# Patient Record
Sex: Female | Born: 1980 | Race: White | Hispanic: No | Marital: Married | Smoking: Never smoker
Health system: Southern US, Community
[De-identification: ages and names within clinical notes are randomized; demographics above are authoritative.]

## PROBLEM LIST (undated history)

## (undated) DIAGNOSIS — Z789 Other specified health status: Secondary | ICD-10-CM

## (undated) DIAGNOSIS — M419 Scoliosis, unspecified: Secondary | ICD-10-CM

## (undated) HISTORY — PX: NO PAST SURGERIES: SHX2092

---

## 2014-08-08 LAB — OB RESULTS CONSOLE GC/CHLAMYDIA
Chlamydia: NEGATIVE
GC PROBE AMP, GENITAL: NEGATIVE

## 2014-09-06 NOTE — L&D Delivery Note (Signed)
Delivery Note Patient pushed well for two hours.  At 9:51 PM a viable female was delivered via Vaginal, Spontaneous Delivery (Presentation: Middle Occiput Posterior).  APGAR: 8, 9; weight pending .   Placenta status: Intact, Spontaneous.  Cord: 3 vessels with the following complications: None.  Cord pH: n/a  Anesthesia: Epidural  Episiotomy: None Lacerations: Deep left sulcal; left labial Suture Repair: 2.0 3.0 vicryl rapide Est. Blood Loss (mL):  438 mL  Mom to postpartum.  Baby to Couplet care / Skin to Skin.  Nicolet Griffy GEFFEL Ivannah Zody 04/02/2015, 10:45 PM

## 2014-09-11 LAB — OB RESULTS CONSOLE HIV ANTIBODY (ROUTINE TESTING): HIV: NONREACTIVE

## 2014-09-11 LAB — OB RESULTS CONSOLE RUBELLA ANTIBODY, IGM: Rubella: IMMUNE

## 2014-09-11 LAB — OB RESULTS CONSOLE HEPATITIS B SURFACE ANTIGEN: Hepatitis B Surface Ag: NEGATIVE

## 2014-09-11 LAB — OB RESULTS CONSOLE RPR: RPR: NONREACTIVE

## 2014-09-11 LAB — OB RESULTS CONSOLE ANTIBODY SCREEN: Antibody Screen: NEGATIVE

## 2014-09-11 LAB — OB RESULTS CONSOLE ABO/RH: RH TYPE: POSITIVE

## 2015-02-24 LAB — OB RESULTS CONSOLE GBS: GBS: NEGATIVE

## 2015-04-01 ENCOUNTER — Encounter (HOSPITAL_COMMUNITY): Payer: Self-pay

## 2015-04-01 ENCOUNTER — Inpatient Hospital Stay (HOSPITAL_COMMUNITY)
Admission: AD | Admit: 2015-04-01 | Discharge: 2015-04-01 | Disposition: A | Discharge status: Home / Self Care | Payer: BLUE CROSS/BLUE SHIELD | Source: Ambulatory Visit | Attending: Obstetrics and Gynecology | Admitting: Obstetrics and Gynecology

## 2015-04-01 DIAGNOSIS — Z3493 Encounter for supervision of normal pregnancy, unspecified, third trimester: Secondary | ICD-10-CM

## 2015-04-01 DIAGNOSIS — Z3A40 40 weeks gestation of pregnancy: Secondary | ICD-10-CM | POA: Diagnosis present

## 2015-04-01 NOTE — MAU Note (Signed)
Contractions every 5-7 mins since 0400. Denies LOF or vag bleeding. +FM

## 2015-04-01 NOTE — Discharge Instructions (Signed)
Labor Induction  Labor induction is when steps are taken to cause a pregnant woman to begin the labor process. Most women go into labor on their own between 37 weeks and 42 weeks of the pregnancy. When this does not happen or when there is a medical need, methods may be used to induce labor. Labor induction causes a pregnant woman's uterus to contract. It also causes the cervix to soften (ripen), open (dilate), and thin out (efface). Usually, labor is not induced before 39 weeks of the pregnancy unless there is a problem with the baby or mother.  Before inducing labor, your health care provider will consider a number of factors, including the following:  The medical condition of you and the baby.   How many weeks along you are.   The status of the baby's lung maturity.   The condition of the cervix.   The position of the baby.  WHAT ARE THE REASONS FOR LABOR INDUCTION? Labor may be induced for the following reasons:  The health of the baby or mother is at risk.   The pregnancy is overdue by 1 week or more.   The water breaks but labor does not start on its own.   The mother has a health condition or serious illness, such as high blood pressure, infection, placental abruption, or diabetes.  The amniotic fluid amounts are low around the baby.   The baby is distressed.  Convenience or wanting the baby to be born on a certain date is not a reason for inducing labor. WHAT METHODS ARE USED FOR LABOR INDUCTION? Several methods of labor induction may be used, such as:   Prostaglandin medicine. This medicine causes the cervix to dilate and ripen. The medicine will also start contractions. It can be taken by mouth or by inserting a suppository into the vagina.   Inserting a thin tube (catheter) with a balloon on the end into the vagina to dilate the cervix. Once inserted, the balloon is expanded with water, which causes the cervix to open.   Stripping the membranes. Your health  care provider separates amniotic sac tissue from the cervix, causing the cervix to be stretched and causing the release of a hormone called progesterone. This may cause the uterus to contract. It is often done during an office visit. You will be sent home to wait for the contractions to begin. You will then come in for an induction.   Breaking the water. Your health care provider makes a hole in the amniotic sac using a small instrument. Once the amniotic sac breaks, contractions should begin. This may still take hours to see an effect.   Medicine to trigger or strengthen contractions. This medicine is given through an IV access tube inserted into a vein in your arm.  All of the methods of induction, besides stripping the membranes, will be done in the hospital. Induction is done in the hospital so that you and the baby can be carefully monitored.  HOW LONG DOES IT TAKE FOR LABOR TO BE INDUCED? Some inductions can take up to 2-3 days. Depending on the cervix, it usually takes less time. It takes longer when you are induced early in the pregnancy or if this is your first pregnancy. If a mother is still pregnant and the induction has been going on for 2-3 days, either the mother will be sent home or a cesarean delivery will be needed. WHAT ARE THE RISKS ASSOCIATED WITH LABOR INDUCTION? Some of the risks of induction   include:   Changes in fetal heart rate, such as too high, too low, or erratic.   Fetal distress.   Chance of infection for the mother and baby.   Increased chance of having a cesarean delivery.   Breaking off (abruption) of the placenta from the uterus (rare).   Uterine rupture (very rare).  When induction is needed for medical reasons, the benefits of induction may outweigh the risks. WHAT ARE SOME REASONS FOR NOT INDUCING LABOR? Labor induction should not be done if:   It is shown that your baby does not tolerate labor.   You have had previous surgeries on your  uterus, such as a myomectomy or the removal of fibroids.   Your placenta lies very low in the uterus and blocks the opening of the cervix (placenta previa).   Your baby is not in a head-down position.   The umbilical cord drops down into the birth canal in front of the baby. This could cut off the baby's blood and oxygen supply.   You have had a previous cesarean delivery.   There are unusual circumstances, such as the baby being extremely premature.  Document Released: 01/12/2007 Document Revised: 04/25/2013 Document Reviewed: 03/22/2013 Exit[employer] Patient Information 2015 [employer]. This information is not intended to replace advice given to you by your health care provider. Make sure you discuss any questions you have with your health care provider.  

## 2015-04-02 ENCOUNTER — Encounter (HOSPITAL_COMMUNITY): Payer: Self-pay | Admitting: *Deleted

## 2015-04-02 ENCOUNTER — Inpatient Hospital Stay (HOSPITAL_COMMUNITY): Payer: BLUE CROSS/BLUE SHIELD | Admitting: Anesthesiology

## 2015-04-02 ENCOUNTER — Inpatient Hospital Stay (HOSPITAL_COMMUNITY)
Admission: AD | Admit: 2015-04-02 | Discharge: 2015-04-04 | DRG: 775 | Disposition: A | Discharge status: Home / Self Care | Payer: BLUE CROSS/BLUE SHIELD | Source: Ambulatory Visit | Attending: Obstetrics | Admitting: Obstetrics

## 2015-04-02 DIAGNOSIS — Z3A40 40 weeks gestation of pregnancy: Secondary | ICD-10-CM | POA: Diagnosis present

## 2015-04-02 HISTORY — DX: Other specified health status: Z78.9

## 2015-04-02 HISTORY — DX: Scoliosis, unspecified: M41.9

## 2015-04-02 LAB — CBC
HCT: 36.8 % (ref 36.0–46.0)
HEMOGLOBIN: 12.7 g/dL (ref 12.0–15.0)
MCH: 31.9 pg (ref 26.0–34.0)
MCHC: 34.5 g/dL (ref 30.0–36.0)
MCV: 92.5 fL (ref 78.0–100.0)
Platelets: 157 10*3/uL (ref 150–400)
RBC: 3.98 MIL/uL (ref 3.87–5.11)
RDW: 13.2 % (ref 11.5–15.5)
WBC: 14.8 10*3/uL — AB (ref 4.0–10.5)

## 2015-04-02 LAB — TYPE AND SCREEN
ABO/RH(D): O POS
Antibody Screen: NEGATIVE

## 2015-04-02 LAB — ABO/RH: ABO/RH(D): O POS

## 2015-04-02 MED ORDER — PHENYLEPHRINE 40 MCG/ML (10ML) SYRINGE FOR IV PUSH (FOR BLOOD PRESSURE SUPPORT)
80.0000 ug | PREFILLED_SYRINGE | INTRAVENOUS | Status: DC | PRN
  Filled 2015-04-02: qty 20
  Filled 2015-04-02: qty 2

## 2015-04-02 MED ORDER — OXYTOCIN 40 UNITS IN LACTATED RINGERS INFUSION - SIMPLE MED
1.0000 m[IU]/min | INTRAVENOUS | Status: DC
  Administered 2015-04-02: 4 m[IU]/min via INTRAVENOUS
  Administered 2015-04-02: 2 m[IU]/min via INTRAVENOUS
  Filled 2015-04-02: qty 1000

## 2015-04-02 MED ORDER — LIDOCAINE HCL (PF) 1 % IJ SOLN
30.0000 mL | INTRAMUSCULAR | Status: DC | PRN
  Administered 2015-04-02: 30 mL via SUBCUTANEOUS
  Filled 2015-04-02: qty 30

## 2015-04-02 MED ORDER — BUTORPHANOL TARTRATE 1 MG/ML IJ SOLN
1.0000 mg | INTRAMUSCULAR | Status: DC | PRN
  Administered 2015-04-02: 1 mg via INTRAVENOUS
  Filled 2015-04-02: qty 1

## 2015-04-02 MED ORDER — PHENYLEPHRINE 40 MCG/ML (10ML) SYRINGE FOR IV PUSH (FOR BLOOD PRESSURE SUPPORT): 80.0000 ug | PREFILLED_SYRINGE | INTRAVENOUS | Status: DC | PRN

## 2015-04-02 MED ORDER — BUTORPHANOL TARTRATE 1 MG/ML IJ SOLN: 1.0000 mg | INTRAMUSCULAR | Status: DC | PRN

## 2015-04-02 MED ORDER — OXYCODONE-ACETAMINOPHEN 5-325 MG PO TABS: 2.0000 | ORAL_TABLET | ORAL | Status: DC | PRN

## 2015-04-02 MED ORDER — CITRIC ACID-SODIUM CITRATE 334-500 MG/5ML PO SOLN: 30.0000 mL | ORAL | Status: DC | PRN

## 2015-04-02 MED ORDER — OXYTOCIN 40 UNITS IN LACTATED RINGERS INFUSION - SIMPLE MED: 62.5000 mL/h | INTRAVENOUS | Status: DC

## 2015-04-02 MED ORDER — DIPHENHYDRAMINE HCL 50 MG/ML IJ SOLN: 12.5000 mg | INTRAMUSCULAR | Status: DC | PRN

## 2015-04-02 MED ORDER — FENTANYL 2.5 MCG/ML BUPIVACAINE 1/10 % EPIDURAL INFUSION (WH - ANES): 14.0000 mL/h | INTRAMUSCULAR | Status: DC | PRN

## 2015-04-02 MED ORDER — OXYCODONE-ACETAMINOPHEN 5-325 MG PO TABS
1.0000 | ORAL_TABLET | ORAL | Status: DC | PRN
  Administered 2015-04-02: 1 via ORAL
  Filled 2015-04-02: qty 1

## 2015-04-02 MED ORDER — OXYTOCIN BOLUS FROM INFUSION
500.0000 mL | INTRAVENOUS | Status: DC
  Administered 2015-04-02: 500 mL via INTRAVENOUS

## 2015-04-02 MED ORDER — ACETAMINOPHEN 325 MG PO TABS: 650.0000 mg | ORAL_TABLET | ORAL | Status: DC | PRN

## 2015-04-02 MED ORDER — EPHEDRINE 5 MG/ML INJ
10.0000 mg | INTRAVENOUS | Status: DC | PRN
  Filled 2015-04-02: qty 2

## 2015-04-02 MED ORDER — TERBUTALINE SULFATE 1 MG/ML IJ SOLN
0.2500 mg | Freq: Once | INTRAMUSCULAR | Status: AC | PRN
  Filled 2015-04-02: qty 1

## 2015-04-02 MED ORDER — LACTATED RINGERS IV SOLN: 500.0000 mL | INTRAVENOUS | Status: DC | PRN

## 2015-04-02 MED ORDER — LACTATED RINGERS IV SOLN
INTRAVENOUS | Status: DC
  Administered 2015-04-02 (×2): via INTRAVENOUS

## 2015-04-02 MED ORDER — ONDANSETRON HCL 4 MG/2ML IJ SOLN: 4.0000 mg | Freq: Four times a day (QID) | INTRAMUSCULAR | Status: DC | PRN

## 2015-04-02 MED ORDER — FENTANYL 2.5 MCG/ML BUPIVACAINE 1/10 % EPIDURAL INFUSION (WH - ANES)
14.0000 mL/h | INTRAMUSCULAR | Status: DC | PRN
  Administered 2015-04-02: 14 mL/h via EPIDURAL
  Filled 2015-04-02: qty 125

## 2015-04-02 MED ORDER — LIDOCAINE HCL (PF) 1 % IJ SOLN
INTRAMUSCULAR | Status: DC | PRN
  Administered 2015-04-02 (×2): 4 mL via EPIDURAL

## 2015-04-02 NOTE — Anesthesia Procedure Notes (Signed)
Epidural Patient location during procedure: OB Start time: 04/02/2015 3:05 PM  Staffing Anesthesiologist: Mal Amabile Performed by: anesthesiologist   Preanesthetic Checklist Completed: patient identified, site marked, surgical consent, pre-op evaluation, timeout performed, IV checked, risks and benefits discussed and monitors and equipment checked  Epidural Patient position: sitting Prep: site prepped and draped and DuraPrep Patient monitoring: continuous pulse ox and blood pressure Approach: midline Location: L3-L4 Injection technique: LOR air  Needle:  Needle type: Tuohy  Needle gauge: 17 G Needle length: 9 cm and 9 Needle insertion depth: 4 cm Catheter type: closed end flexible Catheter size: 19 Gauge Catheter at skin depth: 9 cm Test dose: negative and Other  Assessment Events: blood not aspirated, injection not painful, no injection resistance, negative IV test and no paresthesia  Additional Notes Patient identified. Risks and benefits discussed including failed block, incomplete  Pain control, post dural puncture headache, nerve damage, paralysis, blood pressure Changes, nausea, vomiting, reactions to medications-both toxic and allergic and post Partum back pain. All questions were answered. Patient expressed understanding and wished to proceed. Sterile technique was used throughout procedure. Epidural site was Dressed with sterile barrier dressing. No paresthesias, signs of intravascular injection Or signs of intrathecal spread were encountered.  Patient was more comfortable after the epidural was dosed. Please see RN's note for documentation of vital signs and FHR which are stable.

## 2015-04-02 NOTE — MAU Note (Signed)
Contractions every 5-6 minutes. Denies bright red vaginal bleeding.  States Positive bloody show.  Positive fetal movement Denies SROM/LOF  Denies any infections/complications of pregnancy  GBS negative per patient

## 2015-04-02 NOTE — Anesthesia Preprocedure Evaluation (Signed)
Anesthesia Evaluation  Patient identified by MRN, date of birth, ID band Patient awake    Reviewed: Allergy & Precautions, Patient's Chart, lab work & pertinent test results  Airway Mallampati: II  TM Distance: >3 FB Neck ROM: Full    Dental no notable dental hx. (+) Teeth Intact   Pulmonary neg pulmonary ROS,  breath sounds clear to auscultation  Pulmonary exam normal       Cardiovascular negative cardio ROS Normal cardiovascular examRhythm:Regular Rate:Normal     Neuro/Psych negative neurological ROS  negative psych ROS   GI/Hepatic negative GI ROS, Neg liver ROS,   Endo/Other  negative endocrine ROS  Renal/GU negative Renal ROS  negative genitourinary   Musculoskeletal negative musculoskeletal ROS (+)   Abdominal   Peds  Hematology negative hematology ROS (+)   Anesthesia Other Findings   Reproductive/Obstetrics (+) Pregnancy                             Anesthesia Physical Anesthesia Plan  ASA: II  Anesthesia Plan: Epidural   Post-op Pain Management:    Induction:   Airway Management Planned: Natural Airway  Additional Equipment:   Intra-op Plan:   Post-operative Plan:   Informed Consent: I have reviewed the patients History and Physical, chart, labs and discussed the procedure including the risks, benefits and alternatives for the proposed anesthesia with the patient or authorized representative who has indicated his/her understanding and acceptance.     Plan Discussed with: Anesthesiologist  Anesthesia Plan Comments:         Anesthesia Quick Evaluation

## 2015-04-02 NOTE — H&P (Signed)
34 y.o. G1P0 @ [redacted]w[redacted]d presents with painful ctx since yesterday evening.  Was seen in MAU o/n and was 3 cm.  Otherwise has good fetal movement and no bleeding.  No past medical history on file. No past surgical history on file.  OB History  Gravida Para Term Preterm AB SAB TAB Ectopic Multiple Living  1             # Outcome Date GA Lbr Len/2nd Weight Sex Delivery Anes PTL Lv  1 Current               History   Social History  . Marital Status: Married    Spouse Name: N/A  . Number of Children: N/A  . Years of Education: N/A   Occupational History  . Not on file.   Social History Main Topics  . Smoking status: Never Smoker   . Smokeless tobacco: Not on file  . Alcohol Use: No  . Drug Use: No  . Sexual Activity: Yes   Other Topics Concern  . Not on file   Social History Narrative  . No narrative on file   Review of patient's allergies indicates no known allergies.    Prenatal Transfer Tool  Maternal Diabetes: No Genetic Screening: Normal Maternal Ultrasounds/Referrals: Normal Fetal Ultrasounds or other Referrals:  None Maternal Substance Abuse:  No Significant Maternal Medications:  None Significant Maternal Lab Results: None  ABO, Rh: O/Positive/-- (01/06 0000) Antibody: Negative (01/06 0000) Rubella:   RPR: Nonreactive (01/06 0000)  HBsAg: Negative (01/06 0000)  HIV: Non-reactive (01/06 0000)  GBS: Negative (06/20 0000)    Other PNC: uncomplicated.  122/75 P 96 RR 18   General:  NAD Abdomen:  soft, gravid, EFW 6.5# Ex:  no edema SVE:  6-7/100/0 FHTs:  130s, mod var, cat 1 Toco:  q5 min   A/P   47 y.o. G1P0 [redacted]w[redacted]d presents with labor FSR/ vtx/ GBS neg Epidural upon maternal request  Monica Huerta [employer]

## 2015-04-02 NOTE — Progress Notes (Signed)
Comfortable w epidural  EFM: 130s, mod var, occ early decel w ctx Toco: q4-5 min SVE: 9/100/0, + scalp stim AROM--scant clear fluid  If not complete in 1 hour, will start pitocin.  Anticipate SVD

## 2015-04-03 ENCOUNTER — Inpatient Hospital Stay (HOSPITAL_COMMUNITY): Admit: 2015-04-03 | Payer: BLUE CROSS/BLUE SHIELD

## 2015-04-03 LAB — CBC
HCT: 32.5 % — ABNORMAL LOW (ref 36.0–46.0)
Hemoglobin: 10.9 g/dL — ABNORMAL LOW (ref 12.0–15.0)
MCH: 31.2 pg (ref 26.0–34.0)
MCHC: 33.5 g/dL (ref 30.0–36.0)
MCV: 93.1 fL (ref 78.0–100.0)
Platelets: 148 10*3/uL — ABNORMAL LOW (ref 150–400)
RBC: 3.49 MIL/uL — AB (ref 3.87–5.11)
RDW: 13.6 % (ref 11.5–15.5)
WBC: 16 10*3/uL — AB (ref 4.0–10.5)

## 2015-04-03 LAB — RPR: RPR: NONREACTIVE

## 2015-04-03 MED ORDER — ONDANSETRON HCL 4 MG/2ML IJ SOLN: 4.0000 mg | INTRAMUSCULAR | Status: DC | PRN

## 2015-04-03 MED ORDER — DIPHENHYDRAMINE HCL 25 MG PO CAPS: 25.0000 mg | ORAL_CAPSULE | Freq: Four times a day (QID) | ORAL | Status: DC | PRN

## 2015-04-03 MED ORDER — OXYCODONE-ACETAMINOPHEN 5-325 MG PO TABS: 2.0000 | ORAL_TABLET | ORAL | Status: DC | PRN

## 2015-04-03 MED ORDER — SENNOSIDES-DOCUSATE SODIUM 8.6-50 MG PO TABS
2.0000 | ORAL_TABLET | ORAL | Status: DC
  Administered 2015-04-03 – 2015-04-04 (×2): 2 via ORAL
  Filled 2015-04-03 (×2): qty 2

## 2015-04-03 MED ORDER — IBUPROFEN 600 MG PO TABS
600.0000 mg | ORAL_TABLET | Freq: Four times a day (QID) | ORAL | Status: DC
  Administered 2015-04-03 – 2015-04-04 (×7): 600 mg via ORAL
  Filled 2015-04-03 (×7): qty 1

## 2015-04-03 MED ORDER — SIMETHICONE 80 MG PO CHEW: 80.0000 mg | CHEWABLE_TABLET | ORAL | Status: DC | PRN

## 2015-04-03 MED ORDER — DIBUCAINE 1 % RE OINT: 1.0000 "application " | TOPICAL_OINTMENT | RECTAL | Status: DC | PRN

## 2015-04-03 MED ORDER — WITCH HAZEL-GLYCERIN EX PADS: 1.0000 "application " | MEDICATED_PAD | CUTANEOUS | Status: DC | PRN

## 2015-04-03 MED ORDER — BENZOCAINE-MENTHOL 20-0.5 % EX AERO
1.0000 | INHALATION_SPRAY | CUTANEOUS | Status: DC | PRN
Start: 2015-04-03 — End: 2015-04-04

## 2015-04-03 MED ORDER — ONDANSETRON HCL 4 MG PO TABS: 4.0000 mg | ORAL_TABLET | ORAL | Status: DC | PRN

## 2015-04-03 MED ORDER — PRENATAL MULTIVITAMIN CH
1.0000 | ORAL_TABLET | Freq: Every day | ORAL | Status: DC
  Administered 2015-04-03 – 2015-04-04 (×2): 1 via ORAL
  Filled 2015-04-03 (×2): qty 1

## 2015-04-03 MED ORDER — TETANUS-DIPHTH-ACELL PERTUSSIS 5-2.5-18.5 LF-MCG/0.5 IM SUSP: 0.5000 mL | Freq: Once | INTRAMUSCULAR | Status: DC

## 2015-04-03 MED ORDER — ACETAMINOPHEN 325 MG PO TABS
650.0000 mg | ORAL_TABLET | ORAL | Status: DC | PRN
  Administered 2015-04-03: 650 mg via ORAL
  Filled 2015-04-03: qty 2

## 2015-04-03 MED ORDER — OXYCODONE-ACETAMINOPHEN 5-325 MG PO TABS: 1.0000 | ORAL_TABLET | ORAL | Status: DC | PRN

## 2015-04-03 MED ORDER — LANOLIN HYDROUS EX OINT: TOPICAL_OINTMENT | CUTANEOUS | Status: DC | PRN

## 2015-04-03 NOTE — Lactation Note (Signed)
This note was copied from the chart of Monica Evaluna Utke. Lactation Consultation Note  Patient Name: Monica Huerta [id number] Date: 04/03/2015 Reason for consult: Follow-up assessment  With this mom and now 20 hours old baby, who mom was needing help latching. I was asked by her nurse to go and help, but baby was in dad's arms aat this time. i told mom to call when baby cues.    Maternal Data    Feeding Feeding Type: Breast Fed Length of feed: 10 min  LATCH Score/Interventions Latch: Repeated attempts needed to sustain latch, nipple held in mouth throughout feeding, stimulation needed to elicit sucking reflex. Intervention(s): Skin to skin Intervention(s): Assist with latch  Audible Swallowing: A few with stimulation Intervention(s): Skin to skin  Type of Nipple: Flat Intervention(s): Hand pump  Comfort (Breast/Nipple): Soft / non-tender     Hold (Positioning): Assistance needed to correctly position infant at breast and maintain latch.  LATCH Score: 6  Lactation Tools Discussed/Used     Consult Status Consult Status: Follow-up Date: 04/04/15 Follow-up type: In-patient    Alfred Levins 04/03/2015, 6:59 PM

## 2015-04-03 NOTE — Progress Notes (Signed)
Post Partum Day 1 Subjective: no complaints, up ad lib, voiding, tolerating PO and + flatus  Objective: Blood pressure 106/58, pulse 74, temperature 98.8 F (37.1 C), temperature source Oral, resp. rate 18, height  (1.626 m), weight 73.029 kg (161 lb), SpO2 99 %, unknown if currently breastfeeding.  Physical Exam:  General: alert, cooperative and appears stated age Lochia: appropriate Uterine Fundus: firm   Recent Labs  04/02/15 1235 04/03/15 0530  HGB 12.7 10.9*  HCT 36.8 32.5*    Assessment/Plan: Plan for discharge tomorrow and Breastfeeding   LOS: 1 day   Davon Folta H. 04/03/2015, 8:56 AM

## 2015-04-03 NOTE — Lactation Note (Signed)
This note was copied from the chart of Monica Huerta. Lactation Consultation Note  Patient Name: Monica Huerta [id number] Date: 04/03/2015 Reason for consult: Follow-up assessment  With this mom of a now 43 hours old term baby, mom having trouble keeping baby latched, and baby not satsfifed after breastfeeding.. I assisted mom with latching baby in football hold, but he would not maintain latch, hyper active rooting noted. On exam of baby's mouth, he has an anterior thin, stringy short tongue  frenulum, and an upper lip frenulum that extends to the gum line. I showed these to parents, and explained that this is probably why he has not been able to maintain latch. Mom is being set up with DEP By next lactation consultant, and I fitted mom with a 20 nipple shield. The baby latched with the shileld, was better able to maintain latch, but was still head bobbing and acting very hungry. With hand expression, I was not able to express more than a drop of colostrum, I told the parents it may be time to supplement him with Alimentum formula, and to protect mom's milk with DEP, and preferrably supplement the baby with mom's EBM. Mom does not make eye contact, and is very quiet. I advised hth mom to speak to the pediatrician about my findings in the morning.    Maternal Data    Feeding Feeding Type: Breast Fed Length of feed: 10 min  LATCH Score/Interventions Latch: Repeated attempts needed to sustain latch, nipple held in mouth throughout feeding, stimulation needed to elicit sucking reflex. (baby wit upper lip and tongue tie - on and off breast addec 20 NS with imporved latch, but baby very hungry) Intervention(s): Waking techniques Intervention(s): Assist with latch  Audible Swallowing: None Intervention(s): Hand expression  Type of Nipple: Everted at rest and after stimulation Intervention(s): Double electric pump  Comfort (Breast/Nipple): Soft / non-tender     Hold (Positioning): Assistance  needed to correctly position infant at breast and maintain latch.  LATCH Score: 6  Lactation Tools Discussed/Used Tools: Nipple Dorris Carnes;Pump Nipple shield size: 20 Breast pump type: Double-Electric Breast Pump Initiated by:: Dario Ave, RN IBCLC - opump placed in room by Chauncey Cruel to set up for mom  Date initiated:: 04/03/15   Consult Status Consult Status: Follow-up Date: 04/04/15 Follow-up type: In-patient    Alfred Levins 04/03/2015, 7:45 PM

## 2015-04-03 NOTE — Anesthesia Postprocedure Evaluation (Signed)
Anesthesia Post Note  Patient: Monica Huerta  Procedure(s) Performed: * No procedures listed *  Anesthesia type: Epidural  Patient location: Mother/Baby  Post pain: Pain level controlled  Post assessment: Post-op Vital signs reviewed  Last Vitals:  Filed Vitals:   04/03/15 0450  BP: 106/58  Pulse: 74  Temp: 37.1 C  Resp: 18    Post vital signs: Reviewed  Level of consciousness: awake  Complications: No apparent anesthesia complications

## 2015-04-03 NOTE — Plan of Care (Signed)
Problem: Consults Goal: Skin Care Protocol Initiated - if Braden Score 18 or less If consults are not indicated, leave blank or document N/A  Outcome: Not Applicable Date Met:  94/76/54 Braden scale 21.

## 2015-04-03 NOTE — Lactation Note (Signed)
This note was copied from the chart of Monica Maudy Yonan. Lactation Consultation Note: Mom reports baby has latched well with no pain- has been nursing for about 10 min. Last fed about 1 hour and is asleep in dad's arms. Encouraged to watch for feeding cues and feed whenever she sees them. No questions at present. BF brochure given with resources for support after DC. To call for assist prn  Patient Name: Monica Huerta [id number] Date: 04/03/2015 Reason for consult: Initial assessment   Maternal Data Formula Feeding for Exclusion: No Does the patient have breastfeeding experience prior to this delivery?: No  Feeding   LATCH Score/Interventions                      Lactation Tools Discussed/Used     Consult Status Consult Status: Follow-up Date: 04/04/15 Follow-up type: In-patient    Pamelia Hoit 04/03/2015, 2:22 PM

## 2015-04-04 ENCOUNTER — Encounter (HOSPITAL_COMMUNITY): Payer: Self-pay | Admitting: *Deleted

## 2015-04-04 NOTE — Discharge Summary (Signed)
Obstetric Discharge Summary Reason for Admission: onset of labor Prenatal Procedures: NST and ultrasound Intrapartum Procedures: spontaneous vaginal delivery Postpartum Procedures: none Complications-Operative and Postpartum: Sulcal and labial laceration HEMOGLOBIN  Date Value Ref Range Status  04/03/2015 10.9* 12.0 - 15.0 g/dL Final   HCT  Date Value Ref Range Status  04/03/2015 32.5* 36.0 - 46.0 % Final    Physical Exam:  General: alert, cooperative and appears stated age 62: appropriate Uterine Fundus: firm Incision: healing well DVT Evaluation: No evidence of DVT seen on physical exam.  Discharge Diagnoses: Term Pregnancy-delivered  Discharge Information: Date: 04/04/2015 Activity: pelvic rest Diet: routine Medications: Ibuprofen, Colace and Percocet Condition: improved Instructions: refer to practice specific booklet Discharge to: home Follow-up Information    Follow up with [facility] GEFFEL Chestine Spore, MD In 4 weeks.   Specialty:  Obstetrics   Why:  For a postpartum evaluation   Contact information:   [address] [place] [address] [phone]       Newborn Data: Live born female  Birth Weight: 7 lb 4.8 oz (3310 g) APGAR: 8, 9  Home with mother.  Monica Tift H. 04/04/2015, 12:33 AM

## 2015-04-04 NOTE — Lactation Note (Signed)
This note was copied from the chart of Monica Cailin Gebel. Lactation Consultation Note  Follow up visit made to observe feeding.  Baby starting to wake and show feeding cues.  Positioned baby in football hold on left side.  Mom correctly applied 20 mm nipple shield.  Baby latched easily and deep. He nursed actively with many audible swallows.  Baby came off breast content and shield full of milk.  Report of feeding assessment given to pediatrician.  Patient Name: Monica Huerta [id number] Date: 04/04/2015 Reason for consult: Follow-up assessment   Maternal Data    Feeding Feeding Type: Breast Fed Length of feed: 10 min  LATCH Score/Interventions Latch: Grasps breast easily, tongue down, lips flanged, rhythmical sucking. Intervention(s): Waking techniques Intervention(s): Breast massage;Breast compression  Audible Swallowing: Spontaneous and intermittent  Type of Nipple: Everted at rest and after stimulation  Comfort (Breast/Nipple): Soft / non-tender     Hold (Positioning): No assistance needed to correctly position infant at breast. Intervention(s): Breastfeeding basics reviewed;Support Pillows;Position options;Skin to skin  LATCH Score: 10  Lactation Tools Discussed/Used Tools: Nipple Shields Nipple shield size: 20   Consult Status Consult Status: Complete    Rhiannon Sassaman S 04/04/2015, 2:29 PM

## 2015-04-04 NOTE — Lactation Note (Signed)
This note was copied from the chart of Monica Huerta. Lactation Consultation Note  Follow up visit made prior to discharge.  Mom states baby has been nursing well with nipple shield and she does see colostrum in the shield when baby comes off.  Discharge instructions given including engorgement treatment.  Encouraged to call for assist if desires prior to discharge.  Patient Name: Monica Huerta [id number] Date: 04/04/2015     Maternal Data    Feeding Feeding Type: Breast Fed Length of feed: 10 min  LATCH Score/Interventions                      Lactation Tools Discussed/Used     Consult Status      [name] 04/04/2015, 10:18 AM

## 2015-04-04 NOTE — Discharge Summary (Signed)
Obstetric Discharge Summary Reason for Admission: onset of labor Prenatal Procedures: ultrasound Intrapartum Procedures: spontaneous vaginal delivery Postpartum Procedures: none Complications-Operative and Postpartum: none HEMOGLOBIN  Date Value Ref Range Status  04/03/2015 10.9* 12.0 - 15.0 g/dL Final   HCT  Date Value Ref Range Status  04/03/2015 32.5* 36.0 - 46.0 % Final    Physical Exam:  General: alert Lochia: appropriate Uterine Fundus: firm   Discharge Diagnoses: Term Pregnancy-delivered  Discharge Information: Date: 04/04/2015 Activity: pelvic rest Diet: routine Medications: PNV and Ibuprofen Condition: stable Instructions: refer to practice specific booklet Discharge to: home Follow-up Information    Follow up with [facility] GEFFEL Chestine Spore, MD In 4 weeks.   Specialty:  Obstetrics   Why:  For a postpartum evaluation   Contact information:   [address] [place] [address] [phone]       Follow up with [facility], MD In 1 month.   Specialty:  Obstetrics   Contact information:   [address] [place] [address] [phone]       Newborn Data: Live born female  Birth Weight: 7 lb 4.8 oz (3310 g) APGAR: 8, 9  Home with mother.  Avalynne Diver E 04/04/2015, 9:37 AM

## 2015-04-04 NOTE — Progress Notes (Signed)
PPD#2 Pt doing well Will discharge.

## 2016-09-06 NOTE — L&D Delivery Note (Signed)
Delivery Note At 2:02 PM a viable and healthy female was delivered via Vaginal, Spontaneous Delivery (Presentation: Vertex occiput; anterior ).  APGAR: 9, 9; weight  pending.   Placenta status: spontaneous, intact.  Cord: 3V with a loose nuchal cord x 1 Anesthesia:  Epidural Episiotomy:  none Lacerations: 2nd degree Suture Repair: 3.0 vicryl Est. Blood Loss (mL):  250 cc  Mom to postpartum.  Baby to Couplet care / Skin to Skin.  Harshil Cavallaro H. 07/01/2017, 2:22 PM

## 2016-12-28 LAB — OB RESULTS CONSOLE ANTIBODY SCREEN: Antibody Screen: NEGATIVE

## 2016-12-28 LAB — OB RESULTS CONSOLE ABO/RH: RH TYPE: POSITIVE

## 2016-12-28 LAB — OB RESULTS CONSOLE RPR: RPR: NONREACTIVE

## 2016-12-28 LAB — OB RESULTS CONSOLE HEPATITIS B SURFACE ANTIGEN: HEP B S AG: NEGATIVE

## 2016-12-28 LAB — OB RESULTS CONSOLE GC/CHLAMYDIA
Chlamydia: NEGATIVE
Gonorrhea: NEGATIVE

## 2016-12-28 LAB — OB RESULTS CONSOLE RUBELLA ANTIBODY, IGM: RUBELLA: IMMUNE

## 2016-12-28 LAB — OB RESULTS CONSOLE HIV ANTIBODY (ROUTINE TESTING): HIV: NONREACTIVE

## 2017-02-23 ENCOUNTER — Other Ambulatory Visit (HOSPITAL_COMMUNITY): Payer: Self-pay | Admitting: Obstetrics and Gynecology

## 2017-02-23 DIAGNOSIS — Z3A23 23 weeks gestation of pregnancy: Secondary | ICD-10-CM

## 2017-02-23 DIAGNOSIS — O283 Abnormal ultrasonic finding on antenatal screening of mother: Secondary | ICD-10-CM

## 2017-02-23 DIAGNOSIS — Z3689 Encounter for other specified antenatal screening: Secondary | ICD-10-CM

## 2017-03-11 ENCOUNTER — Encounter (HOSPITAL_COMMUNITY): Payer: Self-pay | Admitting: *Deleted

## 2017-03-15 ENCOUNTER — Encounter (HOSPITAL_COMMUNITY): Payer: Self-pay

## 2017-03-15 ENCOUNTER — Ambulatory Visit (HOSPITAL_COMMUNITY)
Admission: RE | Admit: 2017-03-15 | Discharge: 2017-03-15 | Disposition: A | Discharge status: Home / Self Care | Payer: 59 | Source: Ambulatory Visit | Attending: Obstetrics and Gynecology | Admitting: Obstetrics and Gynecology

## 2017-03-15 ENCOUNTER — Ambulatory Visit (HOSPITAL_COMMUNITY): Payer: BLUE CROSS/BLUE SHIELD

## 2017-03-15 DIAGNOSIS — Z3A23 23 weeks gestation of pregnancy: Secondary | ICD-10-CM | POA: Insufficient documentation

## 2017-03-15 DIAGNOSIS — O283 Abnormal ultrasonic finding on antenatal screening of mother: Secondary | ICD-10-CM

## 2017-03-15 DIAGNOSIS — Z3689 Encounter for other specified antenatal screening: Secondary | ICD-10-CM

## 2017-03-15 DIAGNOSIS — O359XX0 Maternal care for (suspected) fetal abnormality and damage, unspecified, not applicable or unspecified: Secondary | ICD-10-CM | POA: Insufficient documentation

## 2017-03-16 ENCOUNTER — Other Ambulatory Visit (HOSPITAL_COMMUNITY): Payer: Self-pay | Admitting: *Deleted

## 2017-03-16 DIAGNOSIS — O359XX0 Maternal care for (suspected) fetal abnormality and damage, unspecified, not applicable or unspecified: Secondary | ICD-10-CM

## 2017-03-22 ENCOUNTER — Other Ambulatory Visit: Payer: Self-pay

## 2017-04-26 ENCOUNTER — Other Ambulatory Visit (HOSPITAL_COMMUNITY): Payer: Self-pay | Admitting: *Deleted

## 2017-04-26 ENCOUNTER — Ambulatory Visit (HOSPITAL_COMMUNITY)
Admission: RE | Admit: 2017-04-26 | Discharge: 2017-04-26 | Disposition: A | Discharge status: Home / Self Care | Payer: 59 | Source: Ambulatory Visit | Attending: Obstetrics and Gynecology | Admitting: Obstetrics and Gynecology

## 2017-04-26 ENCOUNTER — Encounter (HOSPITAL_COMMUNITY): Payer: Self-pay

## 2017-04-26 ENCOUNTER — Other Ambulatory Visit (HOSPITAL_COMMUNITY): Payer: Self-pay | Admitting: Obstetrics and Gynecology

## 2017-04-26 DIAGNOSIS — O359XX0 Maternal care for (suspected) fetal abnormality and damage, unspecified, not applicable or unspecified: Secondary | ICD-10-CM

## 2017-04-26 DIAGNOSIS — O288 Other abnormal findings on antenatal screening of mother: Secondary | ICD-10-CM | POA: Diagnosis not present

## 2017-04-26 DIAGNOSIS — Q600 Renal agenesis, unilateral: Secondary | ICD-10-CM | POA: Insufficient documentation

## 2017-04-26 DIAGNOSIS — Z3A29 29 weeks gestation of pregnancy: Secondary | ICD-10-CM

## 2017-04-26 DIAGNOSIS — O403XX0 Polyhydramnios, third trimester, not applicable or unspecified: Secondary | ICD-10-CM | POA: Diagnosis not present

## 2017-05-12 ENCOUNTER — Encounter (HOSPITAL_COMMUNITY): Payer: Self-pay

## 2017-05-12 ENCOUNTER — Ambulatory Visit (HOSPITAL_COMMUNITY): Payer: BLUE CROSS/BLUE SHIELD

## 2017-05-12 ENCOUNTER — Ambulatory Visit (HOSPITAL_COMMUNITY)
Admission: RE | Admit: 2017-05-12 | Discharge: 2017-05-12 | Disposition: A | Discharge status: Home / Self Care | Payer: 59 | Source: Ambulatory Visit | Attending: Obstetrics and Gynecology | Admitting: Obstetrics and Gynecology

## 2017-05-12 DIAGNOSIS — Z3A31 31 weeks gestation of pregnancy: Secondary | ICD-10-CM | POA: Diagnosis not present

## 2017-05-12 DIAGNOSIS — O403XX0 Polyhydramnios, third trimester, not applicable or unspecified: Secondary | ICD-10-CM | POA: Insufficient documentation

## 2017-05-12 DIAGNOSIS — O359XX0 Maternal care for (suspected) fetal abnormality and damage, unspecified, not applicable or unspecified: Secondary | ICD-10-CM | POA: Diagnosis present

## 2017-05-12 DIAGNOSIS — Q600 Renal agenesis, unilateral: Secondary | ICD-10-CM

## 2017-06-07 LAB — OB RESULTS CONSOLE GBS: GBS: NEGATIVE

## 2017-06-21 ENCOUNTER — Other Ambulatory Visit: Payer: Self-pay | Admitting: Obstetrics and Gynecology

## 2017-06-21 ENCOUNTER — Telehealth (HOSPITAL_COMMUNITY): Payer: Self-pay | Admitting: *Deleted

## 2017-06-21 ENCOUNTER — Encounter (HOSPITAL_COMMUNITY): Payer: Self-pay | Admitting: *Deleted

## 2017-06-21 NOTE — Telephone Encounter (Signed)
Preadmission screen  

## 2017-07-01 ENCOUNTER — Inpatient Hospital Stay (HOSPITAL_COMMUNITY)
Admission: RE | Admit: 2017-07-01 | Discharge: 2017-07-03 | DRG: 807 | Disposition: A | Discharge status: Home / Self Care | Payer: 59 | Source: Ambulatory Visit | Attending: Obstetrics and Gynecology | Admitting: Obstetrics and Gynecology

## 2017-07-01 ENCOUNTER — Inpatient Hospital Stay (HOSPITAL_COMMUNITY): Payer: 59 | Admitting: Anesthesiology

## 2017-07-01 ENCOUNTER — Encounter (HOSPITAL_COMMUNITY): Payer: Self-pay

## 2017-07-01 ENCOUNTER — Inpatient Hospital Stay (HOSPITAL_COMMUNITY): Admission: AD | Admit: 2017-07-01 | Payer: 59 | Source: Ambulatory Visit | Admitting: Obstetrics

## 2017-07-01 VITALS — BP 116/80 | HR 64 | Temp 97.9°F | Resp 18 | Ht 65.0 in | Wt 150.0 lb

## 2017-07-01 DIAGNOSIS — O358XX0 Maternal care for other (suspected) fetal abnormality and damage, not applicable or unspecified: Secondary | ICD-10-CM | POA: Diagnosis present

## 2017-07-01 DIAGNOSIS — O403XX0 Polyhydramnios, third trimester, not applicable or unspecified: Secondary | ICD-10-CM | POA: Diagnosis present

## 2017-07-01 DIAGNOSIS — Z3A39 39 weeks gestation of pregnancy: Secondary | ICD-10-CM

## 2017-07-01 DIAGNOSIS — Z3493 Encounter for supervision of normal pregnancy, unspecified, third trimester: Secondary | ICD-10-CM

## 2017-07-01 DIAGNOSIS — Z3490 Encounter for supervision of normal pregnancy, unspecified, unspecified trimester: Secondary | ICD-10-CM

## 2017-07-01 LAB — CBC
HEMATOCRIT: 35.9 % — AB (ref 36.0–46.0)
HEMOGLOBIN: 12.1 g/dL (ref 12.0–15.0)
MCH: 31.2 pg (ref 26.0–34.0)
MCHC: 33.7 g/dL (ref 30.0–36.0)
MCV: 92.5 fL (ref 78.0–100.0)
Platelets: 163 10*3/uL (ref 150–400)
RBC: 3.88 MIL/uL (ref 3.87–5.11)
RDW: 14.6 % (ref 11.5–15.5)
WBC: 9 10*3/uL (ref 4.0–10.5)

## 2017-07-01 LAB — TYPE AND SCREEN
ABO/RH(D): O POS
ANTIBODY SCREEN: NEGATIVE

## 2017-07-01 LAB — RPR: RPR Ser Ql: NONREACTIVE

## 2017-07-01 MED ORDER — LACTATED RINGERS IV SOLN: 500.0000 mL | INTRAVENOUS | Status: DC | PRN

## 2017-07-01 MED ORDER — BUTORPHANOL TARTRATE 1 MG/ML IJ SOLN: 1.0000 mg | INTRAMUSCULAR | Status: DC | PRN

## 2017-07-01 MED ORDER — SOD CITRATE-CITRIC ACID 500-334 MG/5ML PO SOLN: 30.0000 mL | ORAL | Status: DC | PRN

## 2017-07-01 MED ORDER — ACETAMINOPHEN 325 MG PO TABS: 650.0000 mg | ORAL_TABLET | ORAL | Status: DC | PRN

## 2017-07-01 MED ORDER — FENTANYL 2.5 MCG/ML BUPIVACAINE 1/10 % EPIDURAL INFUSION (WH - ANES): 14.0000 mL/h | INTRAMUSCULAR | Status: DC | PRN

## 2017-07-01 MED ORDER — TETANUS-DIPHTH-ACELL PERTUSSIS 5-2.5-18.5 LF-MCG/0.5 IM SUSP: 0.5000 mL | Freq: Once | INTRAMUSCULAR | Status: DC

## 2017-07-01 MED ORDER — LACTATED RINGERS IV SOLN: 500.0000 mL | Freq: Once | INTRAVENOUS | Status: DC

## 2017-07-01 MED ORDER — ONDANSETRON HCL 4 MG/2ML IJ SOLN: 4.0000 mg | Freq: Four times a day (QID) | INTRAMUSCULAR | Status: DC | PRN

## 2017-07-01 MED ORDER — OXYCODONE-ACETAMINOPHEN 5-325 MG PO TABS: 2.0000 | ORAL_TABLET | ORAL | Status: DC | PRN

## 2017-07-01 MED ORDER — SIMETHICONE 80 MG PO CHEW: 80.0000 mg | CHEWABLE_TABLET | ORAL | Status: DC | PRN

## 2017-07-01 MED ORDER — TERBUTALINE SULFATE 1 MG/ML IJ SOLN
0.2500 mg | Freq: Once | INTRAMUSCULAR | Status: DC | PRN
  Filled 2017-07-01: qty 1

## 2017-07-01 MED ORDER — OXYTOCIN 40 UNITS IN LACTATED RINGERS INFUSION - SIMPLE MED: 2.5000 [IU]/h | INTRAVENOUS | Status: DC

## 2017-07-01 MED ORDER — EPHEDRINE 5 MG/ML INJ
10.0000 mg | INTRAVENOUS | Status: DC | PRN
  Filled 2017-07-01: qty 2

## 2017-07-01 MED ORDER — OXYTOCIN BOLUS FROM INFUSION: 500.0000 mL | Freq: Once | INTRAVENOUS | Status: DC

## 2017-07-01 MED ORDER — ZOLPIDEM TARTRATE 5 MG PO TABS: 5.0000 mg | ORAL_TABLET | Freq: Every evening | ORAL | Status: DC | PRN

## 2017-07-01 MED ORDER — LIDOCAINE HCL (PF) 1 % IJ SOLN
INTRAMUSCULAR | Status: DC | PRN
  Administered 2017-07-01 (×2): 4 mL

## 2017-07-01 MED ORDER — PHENYLEPHRINE 40 MCG/ML (10ML) SYRINGE FOR IV PUSH (FOR BLOOD PRESSURE SUPPORT)
80.0000 ug | PREFILLED_SYRINGE | INTRAVENOUS | Status: DC | PRN
  Filled 2017-07-01: qty 5

## 2017-07-01 MED ORDER — ONDANSETRON HCL 4 MG PO TABS: 4.0000 mg | ORAL_TABLET | ORAL | Status: DC | PRN

## 2017-07-01 MED ORDER — METHYLERGONOVINE MALEATE 0.2 MG PO TABS: 0.2000 mg | ORAL_TABLET | ORAL | Status: DC | PRN

## 2017-07-01 MED ORDER — OXYCODONE-ACETAMINOPHEN 5-325 MG PO TABS: 1.0000 | ORAL_TABLET | ORAL | Status: DC | PRN

## 2017-07-01 MED ORDER — LACTATED RINGERS IV SOLN: INTRAVENOUS | Status: DC

## 2017-07-01 MED ORDER — PRENATAL MULTIVITAMIN CH
1.0000 | ORAL_TABLET | Freq: Every day | ORAL | Status: DC
  Administered 2017-07-02 – 2017-07-03 (×2): 1 via ORAL
  Filled 2017-07-01 (×2): qty 1

## 2017-07-01 MED ORDER — LIDOCAINE HCL (PF) 1 % IJ SOLN
30.0000 mL | INTRAMUSCULAR | Status: DC | PRN
  Filled 2017-07-01: qty 30

## 2017-07-01 MED ORDER — WITCH HAZEL-GLYCERIN EX PADS: 1.0000 "application " | MEDICATED_PAD | CUTANEOUS | Status: DC | PRN

## 2017-07-01 MED ORDER — PHENYLEPHRINE 40 MCG/ML (10ML) SYRINGE FOR IV PUSH (FOR BLOOD PRESSURE SUPPORT)
80.0000 ug | PREFILLED_SYRINGE | INTRAVENOUS | Status: DC | PRN
  Filled 2017-07-01: qty 10
  Filled 2017-07-01: qty 5

## 2017-07-01 MED ORDER — METHYLERGONOVINE MALEATE 0.2 MG/ML IJ SOLN: 0.2000 mg | INTRAMUSCULAR | Status: DC | PRN

## 2017-07-01 MED ORDER — OXYCODONE-ACETAMINOPHEN 5-325 MG PO TABS
2.0000 | ORAL_TABLET | ORAL | Status: DC | PRN
Start: 2017-07-01 — End: 2017-07-03

## 2017-07-01 MED ORDER — FLEET ENEMA 7-19 GM/118ML RE ENEM: 1.0000 | ENEMA | RECTAL | Status: DC | PRN

## 2017-07-01 MED ORDER — DIBUCAINE 1 % RE OINT: 1.0000 "application " | TOPICAL_OINTMENT | RECTAL | Status: DC | PRN

## 2017-07-01 MED ORDER — IBUPROFEN 600 MG PO TABS
600.0000 mg | ORAL_TABLET | Freq: Four times a day (QID) | ORAL | Status: DC
  Administered 2017-07-01 – 2017-07-03 (×8): 600 mg via ORAL
  Filled 2017-07-01 (×8): qty 1

## 2017-07-01 MED ORDER — BENZOCAINE-MENTHOL 20-0.5 % EX AERO: 1.0000 "application " | INHALATION_SPRAY | CUTANEOUS | Status: DC | PRN

## 2017-07-01 MED ORDER — SENNOSIDES-DOCUSATE SODIUM 8.6-50 MG PO TABS
2.0000 | ORAL_TABLET | ORAL | Status: DC
  Administered 2017-07-02 (×2): 2 via ORAL
  Filled 2017-07-01 (×2): qty 2

## 2017-07-01 MED ORDER — DIPHENHYDRAMINE HCL 25 MG PO CAPS: 25.0000 mg | ORAL_CAPSULE | Freq: Four times a day (QID) | ORAL | Status: DC | PRN

## 2017-07-01 MED ORDER — COCONUT OIL OIL: 1.0000 "application " | TOPICAL_OIL | Status: DC | PRN

## 2017-07-01 MED ORDER — DIPHENHYDRAMINE HCL 50 MG/ML IJ SOLN: 12.5000 mg | INTRAMUSCULAR | Status: DC | PRN

## 2017-07-01 MED ORDER — FENTANYL 2.5 MCG/ML BUPIVACAINE 1/10 % EPIDURAL INFUSION (WH - ANES)
14.0000 mL/h | INTRAMUSCULAR | Status: DC | PRN
  Administered 2017-07-01: 14 mL/h via EPIDURAL
  Administered 2017-07-01: 12 mL/h via EPIDURAL
  Filled 2017-07-01: qty 100

## 2017-07-01 MED ORDER — ONDANSETRON HCL 4 MG/2ML IJ SOLN: 4.0000 mg | INTRAMUSCULAR | Status: DC | PRN

## 2017-07-01 MED ORDER — OXYTOCIN 40 UNITS IN LACTATED RINGERS INFUSION - SIMPLE MED
1.0000 m[IU]/min | INTRAVENOUS | Status: DC
  Administered 2017-07-01: 2 m[IU]/min via INTRAVENOUS
  Filled 2017-07-01: qty 1000

## 2017-07-01 MED ORDER — LACTATED RINGERS IV SOLN
INTRAVENOUS | Status: DC
  Administered 2017-07-01: 1000 mL via INTRAVENOUS

## 2017-07-01 NOTE — Anesthesia Preprocedure Evaluation (Signed)
Anesthesia Evaluation  Patient identified by MRN, date of birth, ID band Patient awake    Reviewed: Allergy & Precautions, Patient's Chart, lab work & pertinent test results  Airway Mallampati: II  TM Distance: >3 FB Neck ROM: Full    Dental no notable dental hx. (+) Teeth Intact   Pulmonary neg pulmonary ROS,    Pulmonary exam normal breath sounds clear to auscultation       Cardiovascular negative cardio ROS Normal cardiovascular exam Rhythm:Regular Rate:Normal     Neuro/Psych negative neurological ROS  negative psych ROS   GI/Hepatic negative GI ROS, Neg liver ROS,   Endo/Other  negative endocrine ROS  Renal/GU negative Renal ROS  negative genitourinary   Musculoskeletal negative musculoskeletal ROS (+)   Abdominal   Peds  Hematology negative hematology ROS (+)   Anesthesia Other Findings   Reproductive/Obstetrics (+) Pregnancy                             Anesthesia Physical  Anesthesia Plan  ASA: II  Anesthesia Plan: Epidural   Post-op Pain Management:    Induction:   PONV Risk Score and Plan:   Airway Management Planned: Natural Airway  Additional Equipment:   Intra-op Plan:   Post-operative Plan:   Informed Consent: I have reviewed the patients History and Physical, chart, labs and discussed the procedure including the risks, benefits and alternatives for the proposed anesthesia with the patient or authorized representative who has indicated his/her understanding and acceptance.     Plan Discussed with:   Anesthesia Plan Comments:         Anesthesia Quick Evaluation

## 2017-07-01 NOTE — Anesthesia Pain Management Evaluation Note (Signed)
  CRNA Pain Management Visit Note  Patient: Monica Huerta, 36 y.o., female  "Hello I am a member of the anesthesia team at [facility]. We have an anesthesia team available at all times to provide care throughout the hospital, including epidural management and anesthesia for C-section. I don't know your plan for the delivery whether it a natural birth, water birth, IV sedation, nitrous supplementation, doula or epidural, but we want to meet your pain goals."   1.Was your pain managed to your expectations on prior hospitalizations?   Yes   2.What is your expectation for pain management during this hospitalization?     Epidural  3.How can we help you reach that goal? Epidural in place and working well.  Record the patient's initial score and the patient's pain goal.   Pain: 0  Pain Goal: 4 [facility] wants you to be able to say your pain was always managed very well.  Jamoni Broadfoot 07/01/2017

## 2017-07-01 NOTE — H&P (Signed)
Olive Bassina Humphries is a 36 y.o. female presenting for IOL for polyhydramnios  36 yo G2P1001 @ 39+0 presents for IOL for polyhydramnios. Her pregnancy has been complicated by a fetus with single kidney and polyhydramnios.  OB History    Gravida Para Term Preterm AB Living   2 1 1     1    SAB TAB Ectopic Multiple Live Births         0 1     Past Medical History:  Diagnosis Date  . Medical history non-contributory   . Scoliosis    Past Surgical History:  Procedure Laterality Date  . NO PAST SURGERIES     Family History: family history is not on file. Social History:  reports that she has never smoked. She has never used smokeless tobacco. She reports that she does not drink alcohol or use drugs.     Maternal Diabetes: No Genetic Screening: Normal Maternal Ultrasounds/Referrals: Abnormal:  Findings:   Fetal Kidney Anomalies Fetal Ultrasounds or other Referrals:  Referred to Materal Fetal Medicine  Maternal Substance Abuse:  No Significant Maternal Medications:  None Significant Maternal Lab Results:  None Other Comments:  None  ROS History Dilation: 3.5 Effacement (%): 80 Station: -1 Exam by:: D herr rn Blood pressure 109/76, pulse (!) 107, temperature 97.8 F (36.6 C), temperature source Oral, resp. rate 18, height 5\' 5"  (1.651 m), weight 68 kg (150 lb), last menstrual period 10/01/2016, unknown if currently breastfeeding. Exam Physical Exam  Prenatal labs: ABO, Rh: --/--/O POS (10/26 0750) Antibody: PENDING (10/26 0750) Rubella: Immune (04/24 0000) RPR: Nonreactive (04/24 0000)  HBsAg: Negative (04/24 0000)  HIV: Non-reactive (04/24 0000)  GBS: Negative (10/02 0000)   Assessment/Plan: 1) Admit 2) Epidural 3) Pitocin 4) AROM when able   Burch Marchuk H. 07/01/2017, 9:35 AM

## 2017-07-01 NOTE — Anesthesia Procedure Notes (Signed)
Epidural Patient location during procedure: OB  Staffing Anesthesiologist: Lewie LoronGERMEROTH, Aleathea Pugmire Performed: anesthesiologist   Preanesthetic Checklist Completed: patient identified, pre-op evaluation, timeout performed, IV checked, risks and benefits discussed and monitors and equipment checked  Epidural Patient position: sitting Prep: site prepped and draped and DuraPrep Patient monitoring: heart rate, continuous pulse ox and blood pressure Approach: midline Location: L3-L4 Injection technique: LOR air and LOR saline  Needle:  Needle type: Tuohy  Needle gauge: 17 G Needle length: 9 cm Needle insertion depth: 5 cm Catheter type: closed end flexible Catheter size: 19 Gauge Catheter at skin depth: 10 cm Test dose: negative  Assessment Sensory level: T8 Events: blood not aspirated, injection not painful, no injection resistance and negative IV test  Additional Notes Needle redirected and LOR re-obtained. No paresthesia Reason for block:procedure for pain

## 2017-07-01 NOTE — Lactation Note (Signed)
This note was copied from a baby's chart. Lactation Consultation Note  Patient Name: Girl Olive Bassina Reder OZHYQ'MToday's Date: 07/01/2017 Reason for consult: Initial assessment Baby at 1 hr of life and mom was worried about baby having a tongue tie. At this visit baby was extending the tongue well over gum ridge. Baby will latch for 2-3 sucks then comes off to look around. Discussed baby behavior, feeding frequency, baby belly size, voids, wt loss, breast changes, and nipple care. Mom stated she can manually express. Discussed baby behavior, feeding frequency, baby belly size, voids, wt loss, breast changes, and nipple care.    Maternal Data Has patient been taught Hand Expression?: Yes Does the patient have breastfeeding experience prior to this delivery?: Yes  Feeding Feeding Type: Breast Fed Length of feed: 20 min (on and off)  LATCH Score Latch: Repeated attempts needed to sustain latch, nipple held in mouth throughout feeding, stimulation needed to elicit sucking reflex.  Audible Swallowing: None  Type of Nipple: Everted at rest and after stimulation  Comfort (Breast/Nipple): Soft / non-tender  Hold (Positioning): Full assist, staff holds infant at breast  LATCH Score: 5  Interventions Interventions: Breast feeding basics reviewed;Assisted with latch;Skin to skin;Breast massage;Hand express;Position options;Support pillows  Lactation Tools Discussed/Used     Consult Status Consult Status: Follow-up Date: 07/02/17 Follow-up type: In-patient    Rulon Eisenmengerlizabeth E Arvis Zwahlen 07/01/2017, 3:15 PM

## 2017-07-02 LAB — CBC
HEMATOCRIT: 35.8 % — AB (ref 36.0–46.0)
Hemoglobin: 11.9 g/dL — ABNORMAL LOW (ref 12.0–15.0)
MCH: 31.2 pg (ref 26.0–34.0)
MCHC: 33.2 g/dL (ref 30.0–36.0)
MCV: 93.7 fL (ref 78.0–100.0)
PLATELETS: 144 10*3/uL — AB (ref 150–400)
RBC: 3.82 MIL/uL — ABNORMAL LOW (ref 3.87–5.11)
RDW: 14.7 % (ref 11.5–15.5)
WBC: 10.6 10*3/uL — AB (ref 4.0–10.5)

## 2017-07-02 NOTE — Progress Notes (Signed)
Post Partum Day 1 Subjective: no complaints, up ad lib, voiding and tolerating PO  Objective: Blood pressure 102/78, pulse 69, temperature (!) 97.3 F (36.3 C), temperature source Oral, resp. rate 16, height 5\' 5"  (1.651 m), weight 68 kg (150 lb), last menstrual period 10/01/2016, SpO2 98 %, unknown if currently breastfeeding.  Physical Exam:  General: alert, cooperative and appears stated age Lochia: appropriate Uterine Fundus: firm  Recent Labs  07/01/17 0750 07/02/17 0527  HGB 12.1 11.9*  HCT 35.9* 35.8*    Assessment/Plan: Plan for discharge tomorrow and Breastfeeding   LOS: 1 day   Denise Bramblett H. 07/02/2017, 11:21 AM

## 2017-07-03 MED ORDER — IBUPROFEN 600 MG PO TABS: 600.0000 mg | ORAL_TABLET | Freq: Four times a day (QID) | ORAL | 0 refills | Status: AC | PRN

## 2017-07-03 MED ORDER — DOCUSATE SODIUM 100 MG PO CAPS: 100.0000 mg | ORAL_CAPSULE | Freq: Two times a day (BID) | ORAL | 0 refills | Status: AC

## 2017-07-03 NOTE — Discharge Summary (Signed)
Obstetric Discharge Summary Reason for Admission: induction of labor Prenatal Procedures: NST and ultrasound Intrapartum Procedures: spontaneous vaginal delivery Postpartum Procedures: none Complications-Operative and Postpartum: none Hemoglobin  Date Value Ref Range Status  07/02/2017 11.9 (L) 12.0 - 15.0 g/dL Final   HCT  Date Value Ref Range Status  07/02/2017 35.8 (L) 36.0 - 46.0 % Final    Physical Exam:  General: alert, cooperative and appears stated age 51Lochia: appropriate Uterine Fundus: firm DVT Evaluation: No evidence of DVT seen on physical exam.  Discharge Diagnoses: Term Pregnancy-delivered  Discharge Information: Date: 07/03/2017 Activity: pelvic rest Diet: routine Medications: Ibuprofen and Colace Condition: improved Instructions: refer to practice specific booklet Discharge to: home Follow-up Information    Monica Huerta, Monica Luton, MD Follow up in 4 week(s).   Specialty:  Obstetrics and Gynecology Why:  For a postpartum evaluation Contact information: [address] [place] [address] [phone]           Newborn Data: Live born female  Birth Weight: 6 lb 13 oz (3090 g) APGAR: 9, 10  Newborn Delivery   Birth date/time:  07/01/2017 14:02:00 Delivery type:  Vaginal, Spontaneous Delivery      Home with mother.  Monica Fitzsimons H. 07/03/2017, 10:14 AM

## 2017-07-03 NOTE — Lactation Note (Signed)
This note was copied from a baby's chart. Lactation Consultation Note  Patient Name: Monica Huerta AVWUJ'WToday's Date: 07/03/2017 Reason for consult: Follow-up assessment;Difficult latch   Follow up with mom of 45 hour old infant. Infant with 4 BF for 10-25 minutes, 2 BF attempts, EBM x 5 via bottle of 0.5-7 cc, 4 voids, 5 stools, and 2 emesis in last 24 hours.   Mom reports infant recently fed at the breast with the # 20 NS for 10 minutes, mom reports she then supplemented infant with expressed colostrum. Mom was getting ready to pump again. Mom has a Medela PIS at home for use. Discussed with parents that infant at 7% weight loss with good output currently. Discussed signs of dehydration and supplementation amounts if weight is down tomorrow or infant output decreases. Infant with follow up Ped appt tomorrow. Enc mom to feed infant STS 8-12 x in 24 hours at first feeding cues, Discussed that if infant is not going to breast at all, the current supplementation amounts is not going to be enough long term. Mom voiced understanding.   Mom was given 2nd # 20 NS and a # 24 NS with indications of when to increase size of NS. Mom is aware to pump at least 8 x a day to protect her supply and to offer infant supplement. Mom reports BF course is the same as it was with there son, she reports she did have to supplement her son due to weight loss.   Reviewed I/O, Engorgement prevention/treatment, signs of dehydration in the infant and breast milk handling and storage. Enc mom to call out for next feeding to assess transfer and latch. LC phone # and BF Support Groups reviewed. Offered mom OP appt to follow up for weight and NS use, mom declined saying she will call if she would like appt. Mom has phone #.      Maternal Data Formula Feeding for Exclusion: No Has patient been taught Hand Expression?: Yes Does the patient have breastfeeding experience prior to this delivery?: Yes  Feeding    LATCH Score                   Interventions    Lactation Tools Discussed/Used Nipple shield size: 20 Breast pump type: Double-Electric Breast Pump WIC Program: No Pump Review: Setup, frequency, and cleaning;Milk Storage Initiated by:: Reviewed and encouraged at least every 3 hours post BF   Consult Status Consult Status: Complete Follow-up type: Call as needed    Ed BlalockSharon S Nikeya Maxim 07/03/2017, 11:47 AM

## 2017-07-05 NOTE — Anesthesia Postprocedure Evaluation (Signed)
Anesthesia Post Note  Patient: Monica Huerta  Procedure(s) Performed: AN AD HOC LABOR EPIDURAL     Patient location during evaluation: Mother Baby Anesthesia Type: Epidural Level of consciousness: awake and alert Pain management: pain level controlled Vital Signs Assessment: post-procedure vital signs reviewed and stable Respiratory status: spontaneous breathing, nonlabored ventilation and respiratory function stable Cardiovascular status: stable Postop Assessment: no headache, no backache and epidural receding Anesthetic complications: no    Last Vitals: There were no vitals filed for this visit.  Last Pain: There were no vitals filed for this visit.               Lewie LoronJohn Gerad Cornelio

## 2019-05-31 IMAGING — US US MFM OB DETAIL+14 WK
1 series · 13 of 28 positions shown · non-contrast
Comparison: none

QUIRIJN DO

1  ANDZELIONIS STANCIAUSKAS          [id number]      [id number]     [id number]
Indications
23 weeks gestation of pregnancy
Encounter for antenatal screening for
malformations
Fetal abnormality - other known or
suspected (EIF, absent right kidney)
OB History
Gravidity:    2         Term:   1
Living:       1
Fetal Evaluation
Num Of Fetuses:     1
Fetal Heart         141
Rate(bpm):
Cardiac Activity:   Observed
Presentation:       Cephalic
Placenta:           Anterior, above cervical os
P. Cord Insertion:  Visualized, central
Amniotic Fluid
AFI FV:      Subjectively within normal limits
Largest Pocket(cm)
6.88
Biometry
BPD:      55.2  mm     G. Age:  22w 6d         19  %    CI:        70.27   %   70 - 86
FL/HC:      19.0   %   18.7 -
HC:       210   mm     G. Age:  23w 1d         18  %    HC/AC:      1.16       1.05 -
AC:      180.3  mm     G. Age:  22w 6d         22  %    FL/BPD:     72.5   %   71 - 87
FL:         40  mm     G. Age:  22w 6d         20  %    FL/AC:      22.2   %   20 - 24
HUM:      36.9  mm     G. Age:  22w 6d         27  %
CER:      24.8  mm     G. Age:  22w 6d         36  %
CM:          6  mm
Est. FW:     546  gm      1 lb 3 oz     37  %
Gestational Age
LMP:           23w 4d       Date:   10/01/16                 EDD:   07/08/17
U/S Today:     23w 0d                                        EDD:   07/12/17
Best:          23w 4d    Det. By:   LMP  (10/01/16)          EDD:   07/08/17
Anatomy
Cranium:               Appears normal         LVOT:                   Appears normal
Cavum:                 Appears normal         Aortic Arch:            Appears normal
Ventricles:            Appears normal         Ductal Arch:            Appears normal
Choroid Plexus:        Appears normal         Diaphragm:              Appears normal
Cerebellum:            Appears normal         Stomach:                Appears normal, left
sided
Posterior Fossa:       Not well visualized    Abdomen:                Appears normal
Nuchal Fold:           Not applicable (>20    Abdominal Wall:         Appears nml (cord
wks GA)                                        insert, abd wall)
Face:                  Appears normal         Cord Vessels:           Appears normal (3
(orbits and profile)                           vessel cord)
Lips:                  Appears normal         Kidneys:                Absent right kidney
Palate:                Appears normal         Bladder:                Appears normal
Thoracic:              Appears normal         Spine:                  Appears normal
Heart:                 Echogenic focus        Upper Extremities:      Appears normal
in LV
RVOT:                  Appears normal         Lower Extremities:      Appears normal
Other:  Fetus appears to be a female. Heels and 5th digit visualized.
Technically difficult due to fetal position.
Cervix Uterus Adnexa
Cervix
Length:           3.14  cm.
Normal appearance by transabdominal scan.
Uterus
No abnormality visualized.
Left Ovary
Not visualized.
Right Ovary
Within normal limits.
Adnexa:       No abnormality visualized. No adnexal mass
visualized.
Impression
INDICATION: 35 yr old ADQWFFW at 37w9d for fetal anatomic
survey. Concern for absent right kidney on outside ultrasound.

[Series 1: us mfm ob detail+14 wk · 141 acquisitions, 13 frames shown]
[im 6/141]
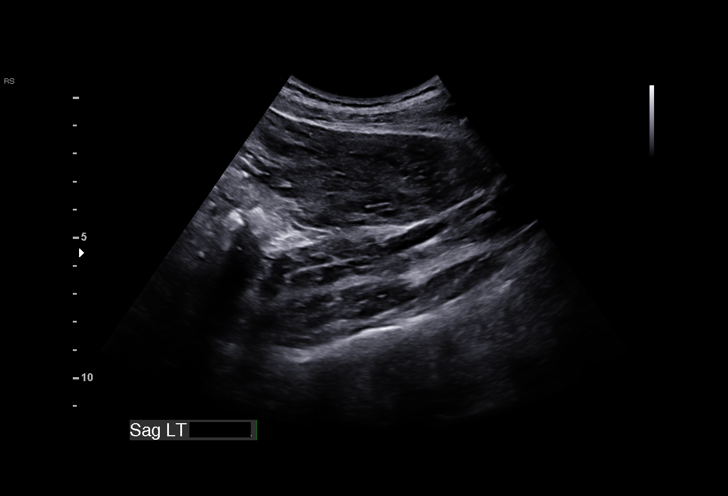
[im 16/141]
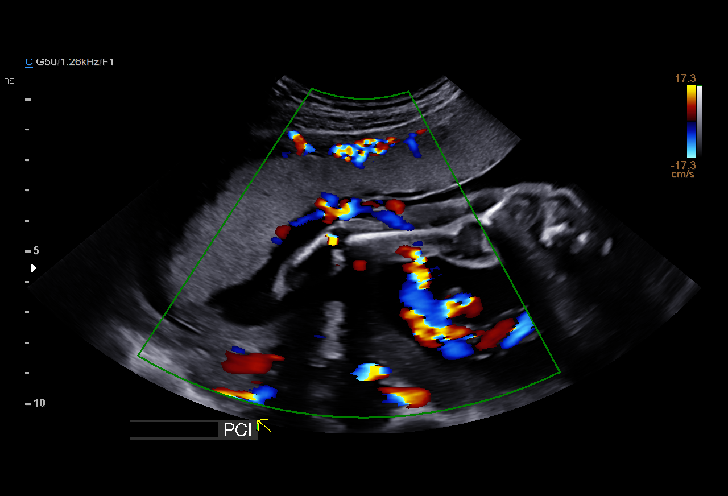
[im 26/141]
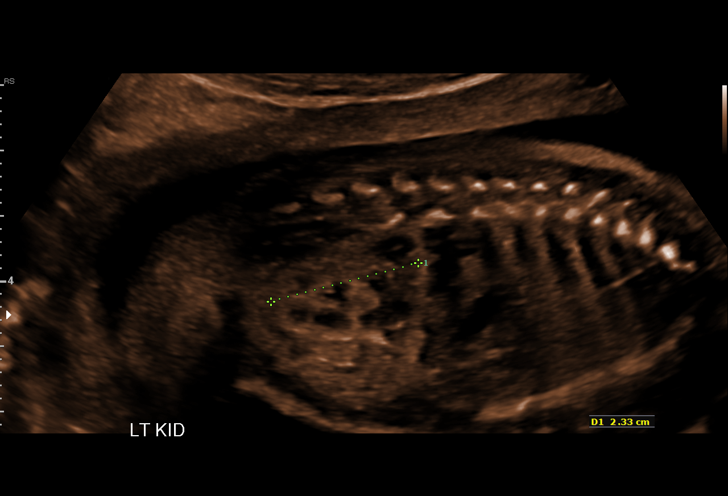
[im 37/141]
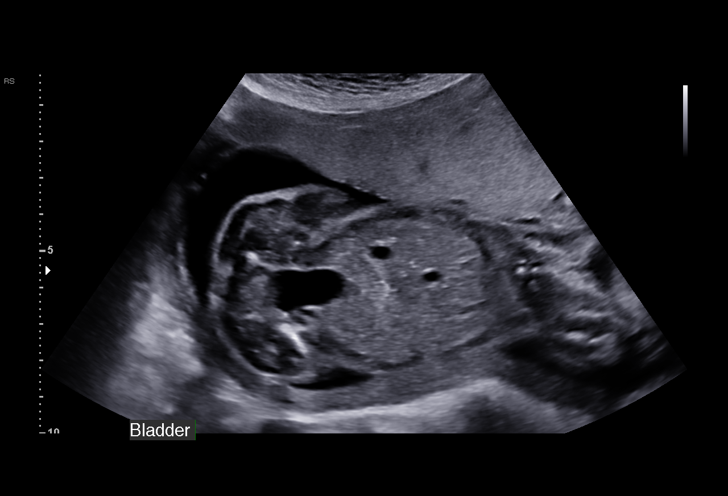
[im 47/141]
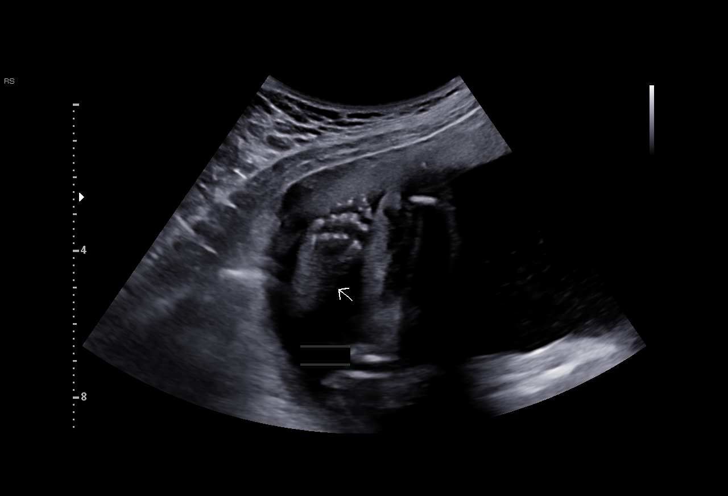
[im 58/141]
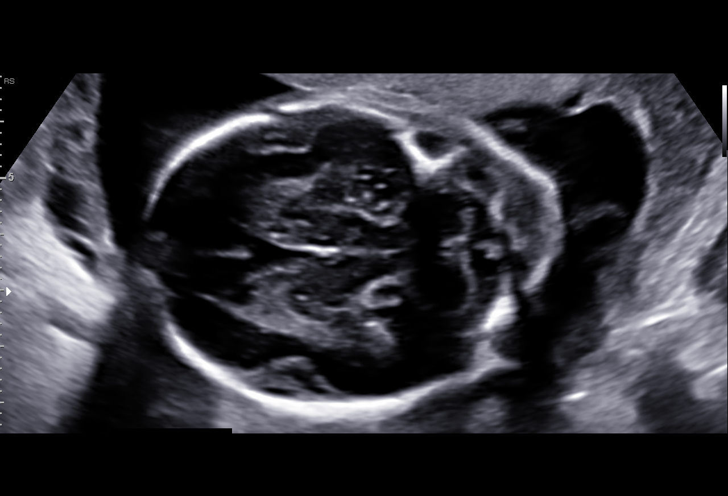
[im 73/141]
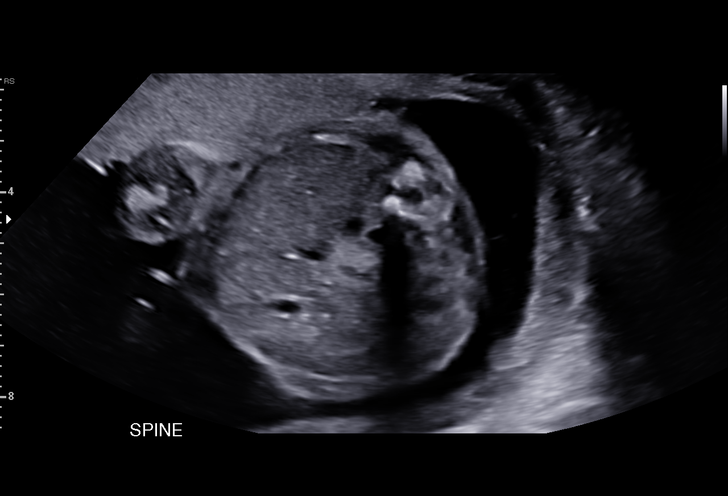
[im 83/141]
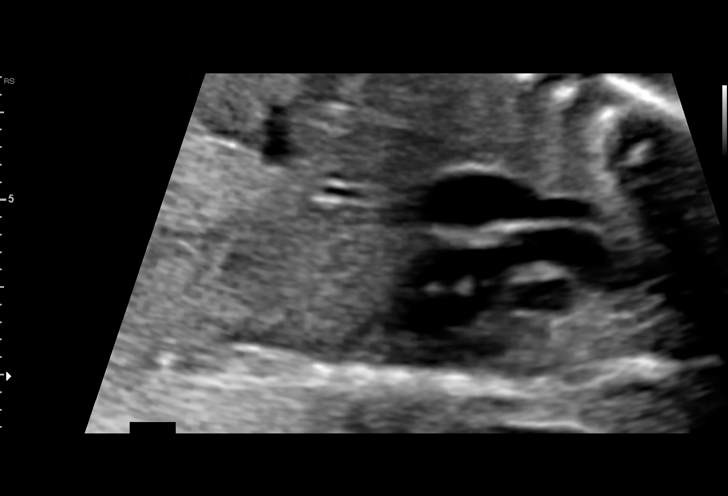
[im 94/141]
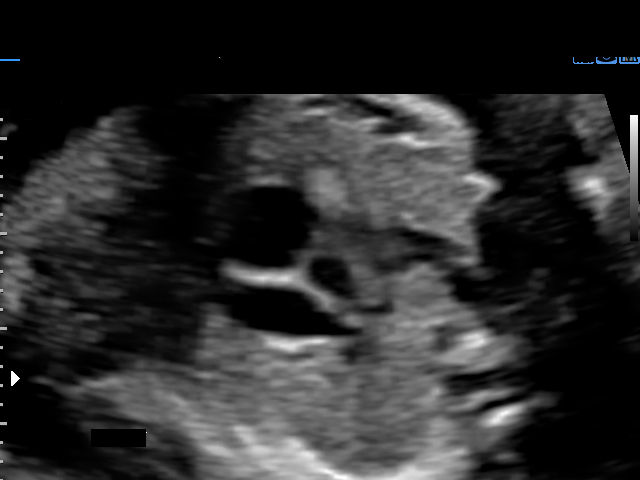
[im 104/141]
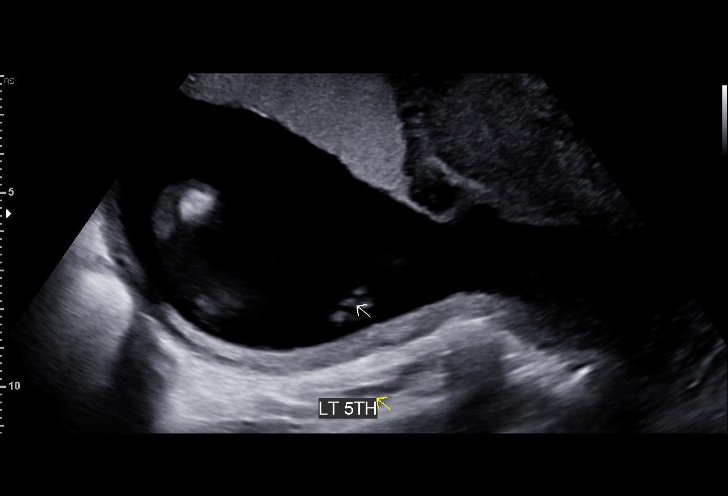
[im 115/141]
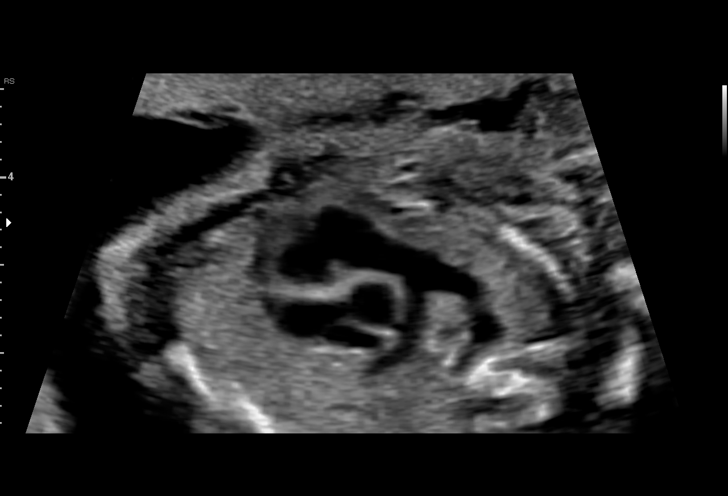
[im 125/141]
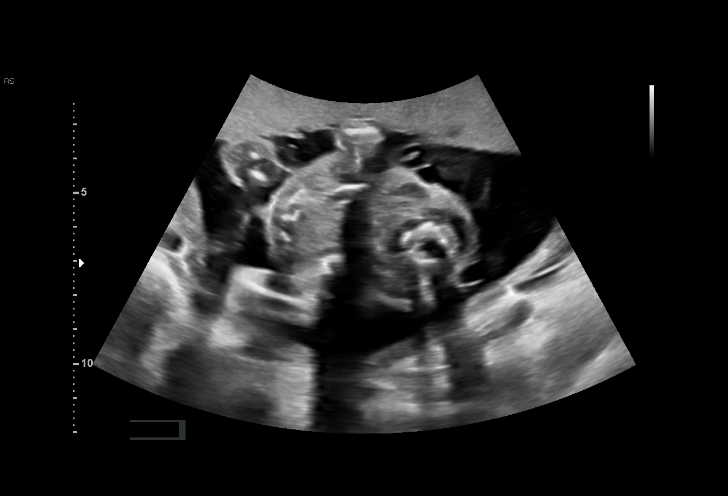
[im 135/141]
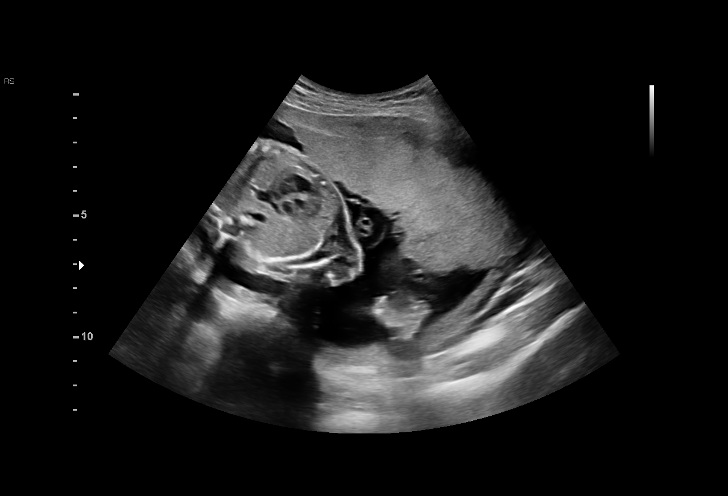

[13 of 28 positions shown; findings below may reference images not displayed]

FINDINGS: 1. Single intrauterine pregnancy.
2. Estimated fetal weight is in the 37th%.
3. Anterior placenta without evidence of previa.
4. Normal amniotic fluid volume.
5. Normal transabdominal cervical length.
6. There is an echogenic focus in the left ventricle.
7. The right kidney is not visualized.
8. The remainder of the fetal anatomic survey is normal.
Recommendations

1. Appropriate fetal growth.
2. Fetal anatomic survey is complete.
3. Echogenic focus in the left ventricle:
- discussed association with increased risk of fetal aneuploidy
(specifically trisomy 21)
- patient had low risk first trimester screen
4. Suspected absent right fetal kidney:
- discussed no other anomalies seen
- discussed prognosis is good
- fetus is female- discussed association with other
genitourinary malformations specifically uterine malformations
- recommend inform Pediatrics at delivery
- recommend follow up in 6 weeks to confirm

## 2019-07-12 IMAGING — US US MFM FETAL BPP W/O NON-STRESS
1 series · 14 of 28 positions shown · non-contrast
Comparison: none

[Series 1: us mfm fetal bpp w/o non-stress · 65 acquisitions, 14 frames shown]
[im 3/65]
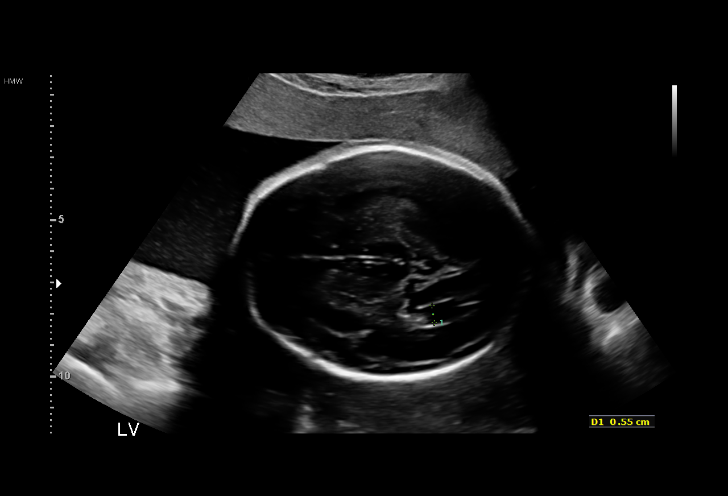
[im 8/65]
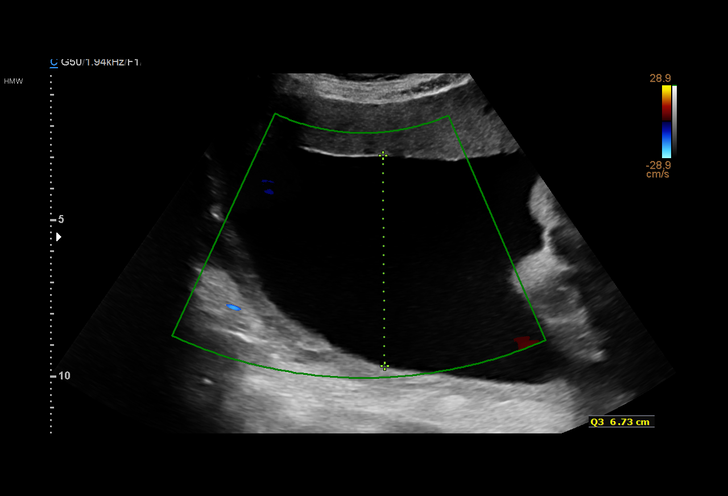
[im 12/65]
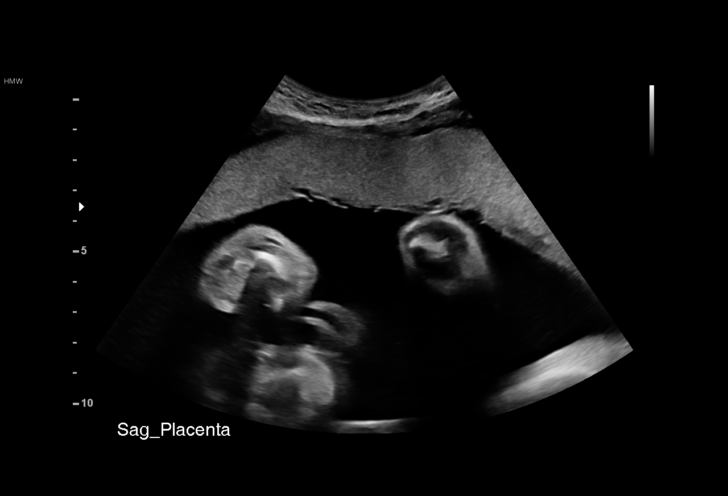
[im 17/65]
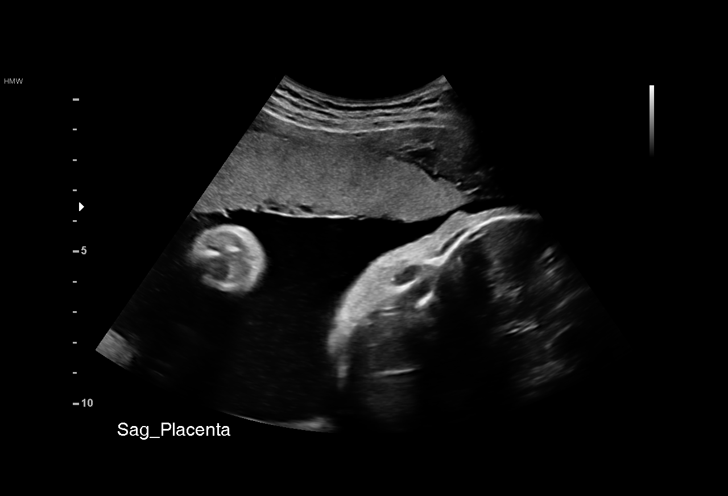
[im 22/65]
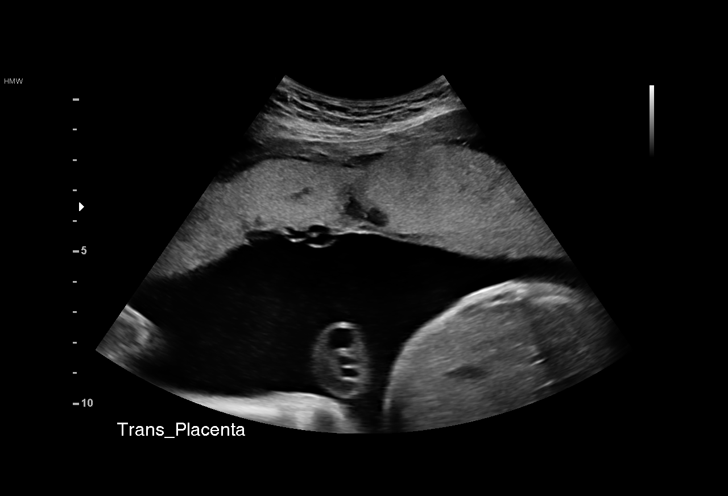
[im 27/65]
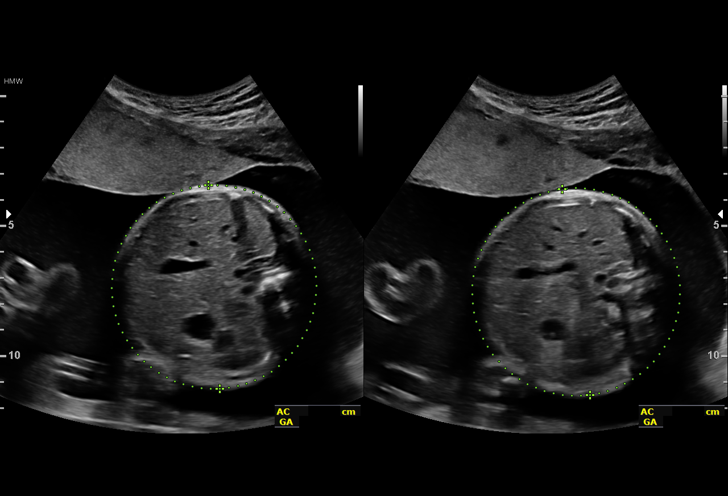
[im 31/65]
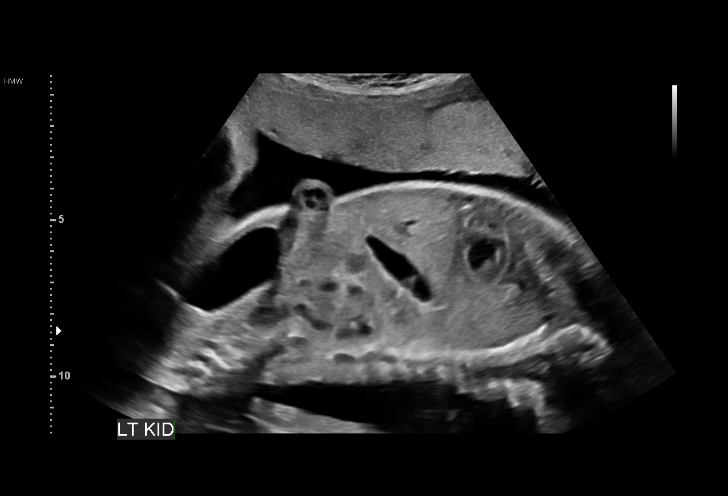
[im 36/65]
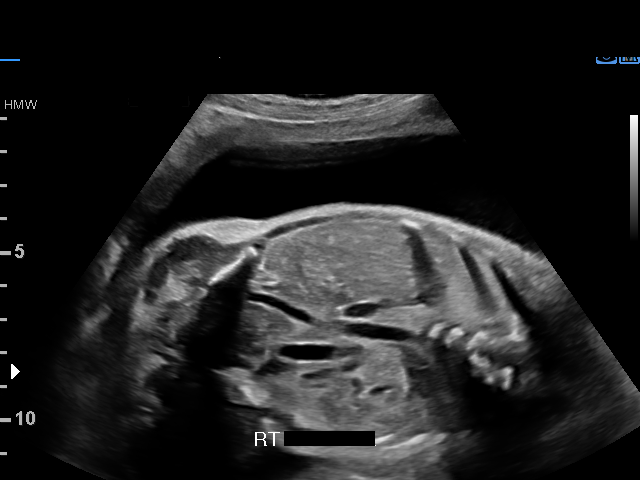
[im 41/65]
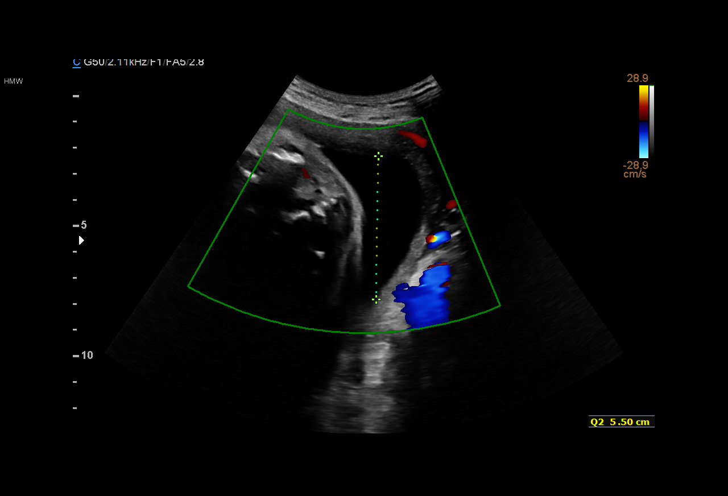
[im 46/65]
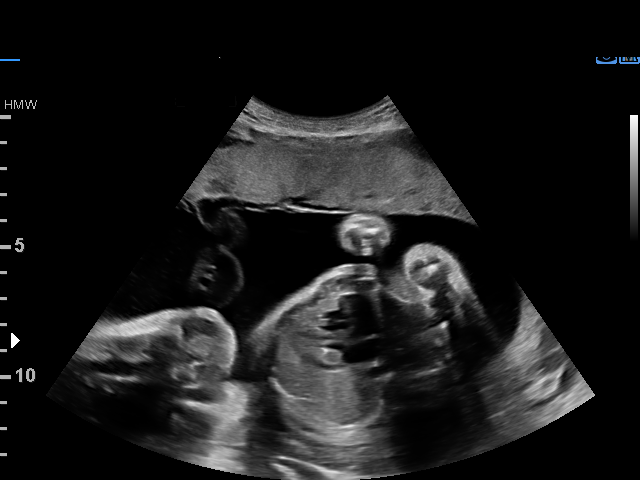
[im 50/65]
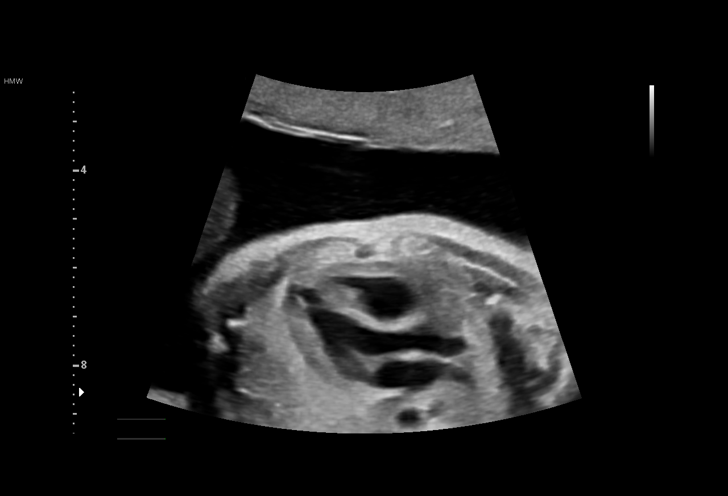
[im 55/65]
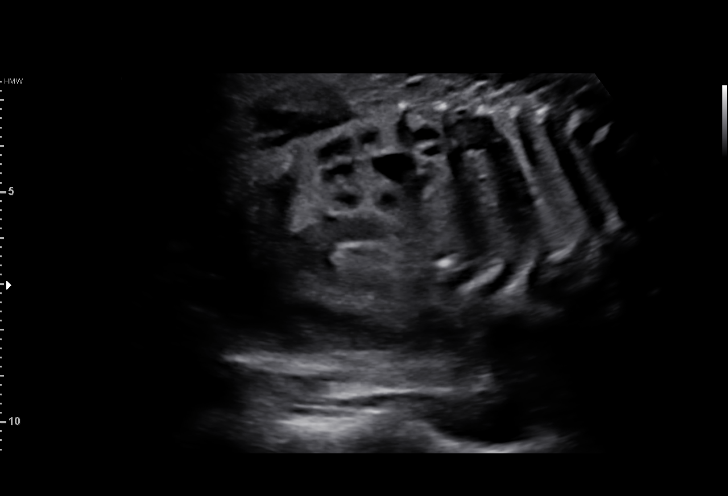
[im 60/65]
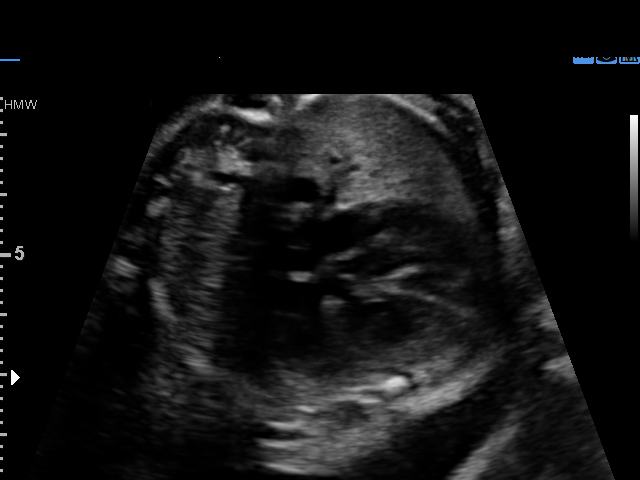
[im 65/65]
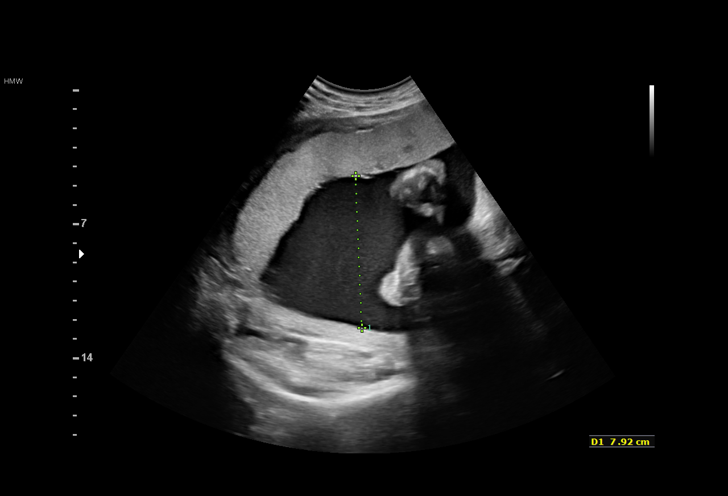

[14 of 28 positions shown; findings below may reference images not displayed]

HALIDU DO

1  HELIODORO GIRGIS            [id number]      [id number]     [id number]
2  HELIODORO GIRGIS            [id number]      [id number]     [id number]
Indications

29 weeks gestation of pregnancy
Fetal abnormality - other known or
suspected (EIF, absent right kidney)
Polyhydramnios, third trimester, antepartum
condition or complication, unspecified fetus
OB History

Gravidity:    2         Term:   1
Living:       1
Fetal Evaluation

Num Of Fetuses:     1
Fetal Heart         135
Rate(bpm):
Cardiac Activity:   Observed
Presentation:       Cephalic
Placenta:           Anterior, above cervical os
P. Cord Insertion:  Visualized, central

Amniotic Fluid
AFI FV:      Subjectively upper-normal

AFI Sum(cm)     %Tile       Largest Pocket(cm)
25.18           97

RUQ(cm)       RLQ(cm)       LUQ(cm)        LLQ(cm)
5.98
Biophysical Evaluation

Amniotic F.V:   Increased                  F. Tone:        Observed
F. Movement:    Observed                   Score:          [DATE]
F. Breathing:   Observed
Biometry

BPD:      73.9  mm     G. Age:  29w 5d         40  %    CI:        76.04   %   70 - 86
FL/HC:      20.2   %   19.2 -
HC:      268.6  mm     G. Age:  29w 2d         12  %    HC/AC:      1.08       0.99 -
AC:      248.1  mm     G. Age:  29w 0d         29  %    FL/BPD:     73.5   %   71 - 87
FL:       54.3  mm     G. Age:  28w 5d         16  %    FL/AC:      21.9   %   20 - 24
HUM:      46.8  mm     G. Age:  27w 4d          8  %

Est. FW:    4447  gm    2 lb 15 oz      41  %
Gestational Age

LMP:           29w 4d       Date:   10/01/16                 EDD:   07/08/17
U/S Today:     29w 1d                                        EDD:   07/11/17
Best:          29w 4d    Det. By:   LMP  (10/01/16)          EDD:   07/08/17
Anatomy

Cranium:               Appears normal         LVOT:                   Appears normal
Cavum:                 Previously seen        Aortic Arch:            Previously seen
Ventricles:            Appears normal         Ductal Arch:            Previously seen
Choroid Plexus:        Previously seen        Diaphragm:              Previously seen
Cerebellum:            Previously seen        Stomach:                Appears normal, left
sided
Posterior Fossa:       Not well visualized    Abdomen:                Previously seen
Nuchal Fold:           Not applicable (>20    Abdominal Wall:         Previously seen
wks GA)
Face:                  Orbits and profile     Cord Vessels:           Previously seen
previously seen
Lips:                  Previously seen        Kidneys:                Absent right kidney
Palate:                Appears normal         Bladder:                Appears normal
Thoracic:              Appears normal         Spine:                  Previously seen
Heart:                 Appears normal         Upper Extremities:      Previously seen
(4CH, axis, and si
RVOT:                  Previously seen        Lower Extremities:      Previously seen

Other:  Fetus appears to be a female. Heels and 5th digit visualized
previously. Technically difficult due to fetal position.
Cervix Uterus Adnexa

Cervix
Not visualized (advanced GA >29wks)

Uterus
No abnormality visualized.

Left Ovary
No adnexal mass visualized.

Right Ovary
No adnexal mass visualized.
Cul De Sac:   No free fluid seen.
Adnexa:       No abnormality visualized.
Impression

SIUP at 29+4 weeks
Right renal agenesis
All other interval fetal anatomy was seen and appeared
normal; anatomic survey complete
High normal amniotic fluid volume vs mild polyhydramnios
Appropriate interval growth with EFW at the 41st %tile
BPP [DATE]
Recommendations

Follow-up ultrasound in 2 weeks to reassess AFV
Rec: both parents have their kidneys imaged

## 2019-11-16 ENCOUNTER — Ambulatory Visit: Payer: Self-pay | Attending: Internal Medicine

## 2019-11-16 DIAGNOSIS — Z23 Encounter for immunization: Secondary | ICD-10-CM

## 2019-11-16 NOTE — Progress Notes (Signed)
   Covid-19 Vaccination Clinic  Name:  Monica Huerta    MRN: [id number] DOB: 1980-11-24  11/16/2019  [name] was observed post Covid-19 immunization for 15 minutes without incident. She was provided with Vaccine Information Sheet and instruction to access the V-Safe system.   [name] was instructed to call 911 with any severe reactions post vaccine: Marland Kitchen Difficulty breathing  . Swelling of face and throat  . A fast heartbeat  . A bad rash all over body  . Dizziness and weakness   Immunizations Administered    Name Date Dose VIS Date Route   Moderna COVID-19 Vaccine 11/16/2019 10:32 AM 0.5 mL 08/07/2019 Intramuscular   Manufacturer: Moderna   Lot: [id number]   NDC: 30076-226-33

## 2019-12-19 ENCOUNTER — Ambulatory Visit: Payer: Self-pay | Attending: Internal Medicine

## 2019-12-19 ENCOUNTER — Ambulatory Visit: Payer: Self-pay

## 2019-12-19 DIAGNOSIS — Z23 Encounter for immunization: Secondary | ICD-10-CM

## 2019-12-19 NOTE — Progress Notes (Signed)
   Covid-19 Vaccination Clinic  Name:  Monica Huerta    MRN: [id number] DOB: Dec 18, 1980  12/19/2019  [name] was observed post Covid-19 immunization for 15 minutes without incident. She was provided with Vaccine Information Sheet and instruction to access the V-Safe system.   [name] was instructed to call 911 with any severe reactions post vaccine: Marland Kitchen Difficulty breathing  . Swelling of face and throat  . A fast heartbeat  . A bad rash all over body  . Dizziness and weakness   Immunizations Administered    Name Date Dose VIS Date Route   Moderna COVID-19 Vaccine 12/19/2019 10:15 AM 0.5 mL 08/07/2019 Intramuscular   Manufacturer: Moderna   Lot: [id number]   NDC: 71959-747-18

## 2019-12-20 ENCOUNTER — Ambulatory Visit: Payer: Self-pay

## 2020-07-20 ENCOUNTER — Ambulatory Visit: Payer: Self-pay | Attending: Internal Medicine

## 2020-07-20 DIAGNOSIS — Z23 Encounter for immunization: Secondary | ICD-10-CM

## 2020-07-20 NOTE — Progress Notes (Signed)
   Covid-19 Vaccination Clinic  Name:  Houda Brau    MRN: [id number] DOB: 27-Dec-1980  07/20/2020  Ms. Haran was observed post Covid-19 immunization for 15 minutes without incident. She was provided with Vaccine Information Sheet and instruction to access the V-Safe system.   Ms. Guerrera was instructed to call 911 with any severe reactions post vaccine: Marland Kitchen Difficulty breathing  . Swelling of face and throat  . A fast heartbeat  . A bad rash all over body  . Dizziness and weakness   Immunizations Administered    No immunizations on file.
